# Patient Record
Sex: Female | Born: 1965 | Race: White | Hispanic: No | Marital: Married | Smoking: Never smoker
Health system: Southern US, Community
[De-identification: ages and names within clinical notes are randomized; demographics above are authoritative.]

## PROBLEM LIST (undated history)

## (undated) DIAGNOSIS — K2970 Gastritis, unspecified, without bleeding: Secondary | ICD-10-CM

## (undated) DIAGNOSIS — T7840XA Allergy, unspecified, initial encounter: Secondary | ICD-10-CM

## (undated) DIAGNOSIS — K219 Gastro-esophageal reflux disease without esophagitis: Secondary | ICD-10-CM

## (undated) DIAGNOSIS — N943 Premenstrual tension syndrome: Secondary | ICD-10-CM

## (undated) HISTORY — DX: Premenstrual tension syndrome: N94.3

## (undated) HISTORY — PX: EYE SURGERY: SHX253

## (undated) HISTORY — DX: Gastro-esophageal reflux disease without esophagitis: K21.9

## (undated) HISTORY — DX: Allergy, unspecified, initial encounter: T78.40XA

## (undated) HISTORY — DX: Gastritis, unspecified, without bleeding: K29.70

## (undated) HISTORY — PX: STRABISMUS SURGERY: SHX218

## (undated) HISTORY — PX: OTHER SURGICAL HISTORY: SHX169

---

## 2002-07-07 HISTORY — PX: BREAST BIOPSY: SHX20

## 2002-07-31 ENCOUNTER — Encounter: Admission: RE | Admit: 2002-07-31 | Discharge: 2002-07-31 | Payer: Self-pay | Admitting: Family Medicine

## 2002-07-31 ENCOUNTER — Encounter: Payer: Self-pay | Admitting: Family Medicine

## 2002-07-31 ENCOUNTER — Encounter (INDEPENDENT_AMBULATORY_CARE_PROVIDER_SITE_OTHER): Payer: Self-pay | Admitting: Specialist

## 2004-03-10 ENCOUNTER — Other Ambulatory Visit: Admission: RE | Admit: 2004-03-10 | Discharge: 2004-03-10 | Payer: Self-pay | Admitting: Family Medicine

## 2004-04-27 ENCOUNTER — Ambulatory Visit: Payer: Self-pay | Admitting: Family Medicine

## 2005-04-06 ENCOUNTER — Ambulatory Visit: Payer: Self-pay | Admitting: Family Medicine

## 2005-06-01 ENCOUNTER — Ambulatory Visit: Payer: Self-pay | Admitting: Internal Medicine

## 2005-07-21 ENCOUNTER — Ambulatory Visit: Payer: Self-pay | Admitting: Family Medicine

## 2006-03-07 ENCOUNTER — Ambulatory Visit: Payer: Self-pay | Admitting: Family Medicine

## 2006-05-03 ENCOUNTER — Ambulatory Visit: Payer: Self-pay | Admitting: Family Medicine

## 2006-05-03 ENCOUNTER — Other Ambulatory Visit: Admission: RE | Admit: 2006-05-03 | Discharge: 2006-05-03 | Payer: Self-pay | Admitting: Family Medicine

## 2006-05-03 ENCOUNTER — Encounter: Payer: Self-pay | Admitting: Family Medicine

## 2006-05-03 LAB — CONVERTED CEMR LAB: Pap Smear: NORMAL

## 2006-05-10 ENCOUNTER — Ambulatory Visit: Payer: Self-pay | Admitting: Family Medicine

## 2006-10-11 ENCOUNTER — Ambulatory Visit: Payer: Self-pay | Admitting: Family Medicine

## 2006-10-11 ENCOUNTER — Encounter: Payer: Self-pay | Admitting: Family Medicine

## 2006-12-18 ENCOUNTER — Ambulatory Visit: Payer: Self-pay | Admitting: Internal Medicine

## 2007-04-06 ENCOUNTER — Ambulatory Visit: Payer: Self-pay | Admitting: Family Medicine

## 2007-05-14 ENCOUNTER — Ambulatory Visit: Payer: Self-pay | Admitting: Family Medicine

## 2007-05-14 ENCOUNTER — Other Ambulatory Visit: Admission: RE | Admit: 2007-05-14 | Discharge: 2007-05-14 | Payer: Self-pay | Admitting: Family Medicine

## 2007-05-14 ENCOUNTER — Encounter: Payer: Self-pay | Admitting: Family Medicine

## 2007-05-14 DIAGNOSIS — M79609 Pain in unspecified limb: Secondary | ICD-10-CM | POA: Insufficient documentation

## 2007-05-15 ENCOUNTER — Encounter (INDEPENDENT_AMBULATORY_CARE_PROVIDER_SITE_OTHER): Payer: Self-pay | Admitting: Internal Medicine

## 2007-05-15 ENCOUNTER — Ambulatory Visit: Payer: Self-pay | Admitting: Family Medicine

## 2007-05-17 ENCOUNTER — Encounter (INDEPENDENT_AMBULATORY_CARE_PROVIDER_SITE_OTHER): Payer: Self-pay | Admitting: *Deleted

## 2007-05-17 LAB — CONVERTED CEMR LAB: Pap Smear: NORMAL

## 2007-05-24 ENCOUNTER — Encounter (INDEPENDENT_AMBULATORY_CARE_PROVIDER_SITE_OTHER): Payer: Self-pay | Admitting: *Deleted

## 2007-05-25 ENCOUNTER — Encounter: Payer: Self-pay | Admitting: Family Medicine

## 2007-06-06 ENCOUNTER — Telehealth: Payer: Self-pay | Admitting: Family Medicine

## 2008-03-05 ENCOUNTER — Ambulatory Visit: Payer: Self-pay | Admitting: Family Medicine

## 2008-05-20 ENCOUNTER — Encounter: Payer: Self-pay | Admitting: Family Medicine

## 2008-05-20 ENCOUNTER — Ambulatory Visit: Payer: Self-pay | Admitting: Family Medicine

## 2008-05-20 ENCOUNTER — Other Ambulatory Visit: Admission: RE | Admit: 2008-05-20 | Discharge: 2008-05-20 | Payer: Self-pay | Admitting: Family Medicine

## 2008-05-21 LAB — CONVERTED CEMR LAB
ALT: 19 units/L (ref 0–35)
AST: 16 units/L (ref 0–37)
Albumin: 3.8 g/dL (ref 3.5–5.2)
Alkaline Phosphatase: 60 units/L (ref 39–117)
BUN: 7 mg/dL (ref 6–23)
Basophils Absolute: 0 10*3/uL (ref 0.0–0.1)
Basophils Relative: 0 % (ref 0.0–3.0)
Bilirubin, Direct: 0.1 mg/dL (ref 0.0–0.3)
CO2: 28 meq/L (ref 19–32)
Calcium: 8.9 mg/dL (ref 8.4–10.5)
Chloride: 108 meq/L (ref 96–112)
Cholesterol: 169 mg/dL (ref 0–200)
Creatinine, Ser: 0.6 mg/dL (ref 0.4–1.2)
Eosinophils Absolute: 0.1 10*3/uL (ref 0.0–0.7)
Eosinophils Relative: 0.9 % (ref 0.0–5.0)
GFR calc Af Amer: 141 mL/min
GFR calc non Af Amer: 117 mL/min
Glucose, Bld: 90 mg/dL (ref 70–99)
HCT: 36.5 % (ref 36.0–46.0)
HDL: 49.4 mg/dL (ref >39.0)
Hemoglobin: 12.6 g/dL (ref 12.0–15.0)
LDL Cholesterol: 109 mg/dL — ABNORMAL HIGH (ref 0–99)
Lymphocytes Relative: 30.9 % (ref 12.0–46.0)
MCHC: 34.5 g/dL (ref 30.0–36.0)
MCV: 88.7 fL (ref 78.0–100.0)
Monocytes Absolute: 0.5 10*3/uL (ref 0.1–1.0)
Monocytes Relative: 8.4 % (ref 3.0–12.0)
Neutro Abs: 3.5 10*3/uL (ref 1.4–7.7)
Neutrophils Relative %: 59.8 % (ref 43.0–77.0)
Platelets: 200 10*3/uL (ref 150–400)
Potassium: 3.7 meq/L (ref 3.5–5.1)
RBC: 4.12 M/uL (ref 3.87–5.11)
RDW: 11.5 % (ref 11.5–14.6)
Sodium: 141 meq/L (ref 135–145)
TSH: 1.77 microintl units/mL (ref 0.35–5.50)
Total Bilirubin: 0.5 mg/dL (ref 0.3–1.2)
Total CHOL/HDL Ratio: 3.4
Total Protein: 6.9 g/dL (ref 6.0–8.3)
Triglycerides: 53 mg/dL (ref 0–149)
VLDL: 11 mg/dL (ref 0–40)
WBC: 5.9 10*3/uL (ref 4.5–10.5)

## 2008-06-04 ENCOUNTER — Ambulatory Visit: Payer: Self-pay | Admitting: Family Medicine

## 2008-06-05 ENCOUNTER — Encounter: Payer: Self-pay | Admitting: Family Medicine

## 2008-10-16 ENCOUNTER — Emergency Department: Payer: Self-pay | Admitting: Emergency Medicine

## 2008-10-16 ENCOUNTER — Telehealth (INDEPENDENT_AMBULATORY_CARE_PROVIDER_SITE_OTHER): Payer: Self-pay | Admitting: *Deleted

## 2009-03-18 ENCOUNTER — Ambulatory Visit: Payer: Self-pay | Admitting: Family Medicine

## 2010-01-27 ENCOUNTER — Ambulatory Visit: Payer: Self-pay | Admitting: Family Medicine

## 2010-01-27 ENCOUNTER — Telehealth: Payer: Self-pay | Admitting: Family Medicine

## 2010-01-27 DIAGNOSIS — K2970 Gastritis, unspecified, without bleeding: Secondary | ICD-10-CM | POA: Insufficient documentation

## 2010-01-27 DIAGNOSIS — K2990 Gastroduodenitis, unspecified, without bleeding: Secondary | ICD-10-CM

## 2010-01-28 ENCOUNTER — Encounter: Payer: Self-pay | Admitting: Family Medicine

## 2010-01-29 ENCOUNTER — Telehealth: Payer: Self-pay | Admitting: Family Medicine

## 2010-02-25 ENCOUNTER — Telehealth: Payer: Self-pay | Admitting: Family Medicine

## 2010-02-25 ENCOUNTER — Encounter: Payer: Self-pay | Admitting: Family Medicine

## 2010-03-16 ENCOUNTER — Ambulatory Visit: Payer: Self-pay | Admitting: Family Medicine
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
## 2010-07-06 NOTE — Progress Notes (Signed)
Summary: needs something for reflux  Phone Note Call from Patient   Caller: Patient Call For: Judith Part MD Summary of Call: Pt's insurance company denied prior auth for nexium.  Pt is asking if [facility] [place]. Initial call taken by: Lowella Petties CMA,  January 29, 2010 10:43 AM  Follow-up for Phone Call        need to change to lansoprazole (that is generic prevacid) px written on EMR for call in  let me know if this does not work well Follow-up by: Judith Part MD,  January 29, 2010 11:18 AM  Additional Follow-up for Phone Call Additional follow up Details #1::        [facility] as instructed. Left message for patient to call back. Lewanda Rife LPN  January 29, 2010 12:34 PM   Advised pt. Additional Follow-up by: Lowella Petties CMA,  January 29, 2010 3:42 PM    New/Updated Medications: LANSOPRAZOLE 30 MG CPDR (LANSOPRAZOLE) 1 by mouth once daily in am Prescriptions: LANSOPRAZOLE 30 MG CPDR (LANSOPRAZOLE) 1 by mouth once daily in am  #30 x 11   Entered and Authorized by:   Judith Part MD   Signed by:   Lewanda Rife LPN on 86/57/8469   Method used:   Telephoned to ...         RxID:   [id number]

## 2010-07-06 NOTE — Medication Information (Signed)
Summary: Prior Authorization & Denial for Nexium/[facility]  Prior Authorization & Denial for Nexium/[facility]   Imported By: Lanelle Bal 02/09/2010 08:30:14  _____________________________________________________________________  External Attachment:    Type:   Image     Comment:   External Document

## 2010-07-06 NOTE — Progress Notes (Signed)
Summary: prior auth for nexium   Phone Note Call from Patient   Caller: [facility] Call For: Judith Part MD Summary of Call: Prior auth is required for nexium. Form is on your shelf.  Initial call taken by: Melody Comas,  January 27, 2010 4:19 PM  Follow-up for Phone Call        form done and in nurse in box   Follow-up by: Judith Part MD,  January 27, 2010 5:03 PM  Additional Follow-up for Phone Call Additional follow up Details #1::        Completed form faxed to [phone] as instructed. Form given to [place].Lewanda Rife LPN  January 28, 2010 8:53 AM      Appended Document: prior auth for nexium  Prior Berkley Harvey was denied for  nexium, letter is on your shelf.

## 2010-07-06 NOTE — Progress Notes (Signed)
Summary: regarding lansoprazole  Phone Note Call from Patient Call back at Home Phone [phone]   Caller: Patient Call For: Judith Part MD Summary of Call: Pt has been taking lansoprazole for about a month.  This has helped some but she still has burning, gas.  She is asking if she should try something else or give this more time to work.  We can try to get a prior auth on nexium, since this isnt working well.  [facility] [place] Initial call taken by: Lowella Petties CMA,  February 25, 2010 1:00 PM  Follow-up for Phone Call        lets do another prior Berkley Harvey now that she has failed that -thanks  Follow-up by: Judith Part MD,  February 25, 2010 1:10 PM  Additional Follow-up for Phone Call Additional follow up Details #1::        Prior auth given over the phone for nexium.  We will need to [facility] [place].         Lowella Petties CMA  February 25, 2010 3:50 PM  px written on EMR for call in   Additional Follow-up by: Judith Part MD,  February 25, 2010 7:47 PM    Additional Follow-up for Phone Call Additional follow up Details #2::    Pt advised that nexium will be sent to pharmacy.  [facility] [place].         Lowella Petties CMA  February 26, 2010 9:10 AM   New/Updated Medications: NEXIUM 40 MG CPDR (ESOMEPRAZOLE MAGNESIUM) 1 by mouth once daily in am Prescriptions: NEXIUM 40 MG CPDR (ESOMEPRAZOLE MAGNESIUM) 1 by mouth once daily in am  #90 x 3   Entered by:   Lowella Petties CMA   Authorized by:   Judith Part MD   Signed by:   Lowella Petties CMA on 02/26/2010   Method used:   Telephoned to ...       [facility] [phone][facility] (retail)       [address]       [place], [place]  [address]       Ph: [phone]       Fax: [phone]   RxID:   [id number]

## 2010-07-06 NOTE — Assessment & Plan Note (Signed)
Summary: [facility] flu shot/rbh  Nurse Visit   Allergies: 1)  * Oxycodone  Orders Added: 1)  Admin 1st Vaccine [90471] 2)  Flu Vaccine 23yrs + [[address]109]  Flu Vaccine Consent Questions     Do you have a history of severe allergic reactions to this vaccine? no    Any prior history of allergic reactions to egg and/or gelatin? no    Do you have a sensitivity to the preservative Thimersol? no    Do you have a past history of Guillan-Barre Syndrome? no    Do you currently have an acute febrile illness? no    Have you ever had a severe reaction to latex? no    Vaccine information given and explained to patient? yes    Are you currently pregnant? no    Lot Number:[id number]   Exp Date:12/04/2010   Site Given  Left Deltoid IM

## 2010-07-06 NOTE — Medication Information (Signed)
Summary: Nexium Approved  Nexium Approved   Imported By: Maryln Gottron 03/05/2010 12:20:44  _____________________________________________________________________  External Attachment:    Type:   Image     Comment:   External Document

## 2010-07-30 ENCOUNTER — Telehealth (INDEPENDENT_AMBULATORY_CARE_PROVIDER_SITE_OTHER): Payer: Self-pay | Admitting: *Deleted

## 2010-07-30 ENCOUNTER — Other Ambulatory Visit (INDEPENDENT_AMBULATORY_CARE_PROVIDER_SITE_OTHER): Payer: 59

## 2010-07-30 ENCOUNTER — Other Ambulatory Visit: Payer: Self-pay | Admitting: Family Medicine

## 2010-07-30 ENCOUNTER — Encounter (INDEPENDENT_AMBULATORY_CARE_PROVIDER_SITE_OTHER): Payer: Self-pay | Admitting: *Deleted

## 2010-07-30 DIAGNOSIS — Z0000 Encounter for general adult medical examination without abnormal findings: Secondary | ICD-10-CM

## 2010-07-30 LAB — LIPID PANEL
Cholesterol: 180 mg/dL (ref 0–200)
LDL Cholesterol: 120 mg/dL — ABNORMAL HIGH (ref 0–99)

## 2010-07-30 LAB — BASIC METABOLIC PANEL
BUN: 12 mg/dL (ref 6–23)
CO2: 27 mEq/L (ref 19–32)
Chloride: 108 mEq/L (ref 96–112)
Creatinine, Ser: 0.8 mg/dL (ref 0.4–1.2)
Potassium: 4.4 mEq/L (ref 3.5–5.1)
Sodium: 141 mEq/L (ref 135–145)

## 2010-07-30 LAB — HEPATIC FUNCTION PANEL
ALT: 18 U/L (ref 0–35)
AST: 17 U/L (ref 0–37)
Albumin: 4 g/dL (ref 3.5–5.2)
Alkaline Phosphatase: 66 U/L (ref 39–117)
Total Bilirubin: 0.5 mg/dL (ref 0.3–1.2)
Total Protein: 7.1 g/dL (ref 6.0–8.3)

## 2010-07-30 LAB — CBC WITH DIFFERENTIAL/PLATELET
Basophils Relative: 0.5 % (ref 0.0–3.0)
Eosinophils Relative: 1.2 % (ref 0.0–5.0)
HCT: 36.5 % (ref 36.0–46.0)
Hemoglobin: 12.5 g/dL (ref 12.0–15.0)
Lymphocytes Relative: 39.4 % (ref 12.0–46.0)
Lymphs Abs: 2 10*3/uL (ref 0.7–4.0)
MCHC: 34.3 g/dL (ref 30.0–36.0)
MCV: 88.9 fl (ref 78.0–100.0)
Monocytes Absolute: 0.4 10*3/uL (ref 0.1–1.0)
Neutrophils Relative %: 51.8 % (ref 43.0–77.0)
Platelets: 186 10*3/uL (ref 150.0–400.0)
RBC: 4.1 Mil/uL (ref 3.87–5.11)
WBC: 5.2 10*3/uL (ref 4.5–10.5)

## 2010-07-30 LAB — TSH: TSH: 1.79 u[IU]/mL (ref 0.35–5.50)

## 2010-08-03 ENCOUNTER — Other Ambulatory Visit (HOSPITAL_COMMUNITY)
Admission: RE | Admit: 2010-08-03 | Discharge: 2010-08-03 | Disposition: A | Discharge status: Home / Self Care | Payer: 59 | Source: Ambulatory Visit | Attending: Family Medicine | Admitting: Family Medicine

## 2010-08-03 ENCOUNTER — Encounter: Payer: Self-pay | Admitting: Family Medicine

## 2010-08-03 ENCOUNTER — Encounter (INDEPENDENT_AMBULATORY_CARE_PROVIDER_SITE_OTHER): Payer: 59 | Admitting: Family Medicine

## 2010-08-03 ENCOUNTER — Other Ambulatory Visit: Payer: Self-pay | Admitting: Family Medicine

## 2010-08-03 DIAGNOSIS — K2970 Gastritis, unspecified, without bleeding: Secondary | ICD-10-CM

## 2010-08-03 DIAGNOSIS — Z01419 Encounter for gynecological examination (general) (routine) without abnormal findings: Secondary | ICD-10-CM

## 2010-08-03 DIAGNOSIS — Z1231 Encounter for screening mammogram for malignant neoplasm of breast: Secondary | ICD-10-CM

## 2010-08-03 DIAGNOSIS — Z124 Encounter for screening for malignant neoplasm of cervix: Secondary | ICD-10-CM | POA: Insufficient documentation

## 2010-08-03 DIAGNOSIS — Z0000 Encounter for general adult medical examination without abnormal findings: Secondary | ICD-10-CM

## 2010-08-03 DIAGNOSIS — Z1159 Encounter for screening for other viral diseases: Secondary | ICD-10-CM | POA: Insufficient documentation

## 2010-08-03 LAB — HM PAP SMEAR

## 2010-08-03 NOTE — Progress Notes (Signed)
----   Converted from flag ---- ---- 07/29/2010 8:04 PM, Judith Part MD wrote: please check wellness and lipid /v70.0 thanks   ---- 07/27/2010 11:53 AM, Liane Comber CMA (AAMA) wrote: Lab orders please! Good Morning! This pt is scheduled for cpx labs Friday, which labs to draw and dx codes to use? Thanks Tasha ------------------------------

## 2010-08-06 ENCOUNTER — Encounter (INDEPENDENT_AMBULATORY_CARE_PROVIDER_SITE_OTHER): Payer: Self-pay | Admitting: *Deleted
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
## 2010-08-12 NOTE — Letter (Signed)
Summary: Results Follow up Letter  [place] [facility]  [address] [place], [place] [address]   Phone: [phone]  Fax: [phone]    08/06/2010 MRN: [id number]    [facility] [address] [place], [place]  [address]  [place]    Dear Ms. Mele,  The following are the results of your recent test(s):  Test         Result    Pap Smear:        Normal __X___  Not Normal _____ Comments: ______________________________________________________ Cholesterol: LDL(Bad cholesterol):         Your goal is less than:         HDL (Good cholesterol):       Your goal is more than: Comments:  ______________________________________________________ Mammogram:        Normal _____  Not Normal _____ Comments:  ___________________________________________________________________ Hemoccult:        Normal _____  Not normal _______ Comments:    _____________________________________________________________________ Other Tests:    We routinely do not discuss normal results over the telephone.  If you desire a copy of the results, or you have any questions about this information we can discuss them at your next office visit.   Sincerely,    Marne A. Milinda Antis, M.D.  MAT:lsf

## 2010-08-17 ENCOUNTER — Encounter: Payer: Self-pay | Admitting: Family Medicine

## 2010-08-17 ENCOUNTER — Ambulatory Visit: Payer: Self-pay | Admitting: Family Medicine

## 2010-08-17 LAB — HM MAMMOGRAPHY: HM Mammogram: NORMAL

## 2010-08-23 ENCOUNTER — Encounter: Payer: Self-pay | Admitting: Family Medicine

## 2010-09-02 NOTE — Letter (Signed)
Summary: Results Follow up Letter  [place] at [facility]  [address] [place], [place] [address]   Phone: [phone]  Fax: [phone]    08/23/2010 MRN: [id number]    [facility] [address] [place], [place]  [address]  [place]    Dear Ms. Chavers,  The following are the results of your recent test(s):  Test         Result    Pap Smear:        Normal _____  Not Normal _____ Comments: ______________________________________________________ Cholesterol: LDL(Bad cholesterol):         Your goal is less than:         HDL (Good cholesterol):       Your goal is more than: Comments:  ______________________________________________________ Mammogram:        Normal __X___  Not Normal _____ Comments:Repeat in one year.   ___________________________________________________________________ Hemoccult:        Normal _____  Not normal _______ Comments:    _____________________________________________________________________ Other Tests:    We routinely do not discuss normal results over the telephone.  If you desire a copy of the results, or you have any questions about this information we can discuss them at your next office visit.   Sincerely,    Idamae Schuller [facility],MD  MT/ri

## 2011-03-22 ENCOUNTER — Ambulatory Visit (INDEPENDENT_AMBULATORY_CARE_PROVIDER_SITE_OTHER): Payer: 59

## 2011-03-22 DIAGNOSIS — Z23 Encounter for immunization: Secondary | ICD-10-CM

## 2011-11-03 ENCOUNTER — Telehealth: Payer: Self-pay | Admitting: Family Medicine

## 2011-11-03 DIAGNOSIS — Z0000 Encounter for general adult medical examination without abnormal findings: Secondary | ICD-10-CM | POA: Insufficient documentation

## 2011-11-03 NOTE — Telephone Encounter (Signed)
Message copied by Judy Pimple on Thu Nov 03, 2011  7:02 PM ------      Message from: Baldomero Lamy      Created: Wed Oct 26, 2011 10:18 AM      Regarding: Cpx labsFri 5/31       Please order  future cpx labs for pt's upcomming lab appt.      Thanks      Rodney Booze

## 2011-11-04 ENCOUNTER — Other Ambulatory Visit (INDEPENDENT_AMBULATORY_CARE_PROVIDER_SITE_OTHER): Payer: 59

## 2011-11-04 DIAGNOSIS — Z0000 Encounter for general adult medical examination without abnormal findings: Secondary | ICD-10-CM

## 2011-11-04 LAB — COMPREHENSIVE METABOLIC PANEL
Albumin: 3.9 g/dL (ref 3.5–5.2)
BUN: 15 mg/dL (ref 6–23)
CO2: 25 mEq/L (ref 19–32)
Calcium: 8.8 mg/dL (ref 8.4–10.5)
Chloride: 106 mEq/L (ref 96–112)
GFR: 102.42 mL/min (ref >60.00)
Glucose, Bld: 82 mg/dL (ref 70–99)
Potassium: 4.1 mEq/L (ref 3.5–5.1)

## 2011-11-04 LAB — CBC WITH DIFFERENTIAL/PLATELET
Basophils Relative: 0.5 % (ref 0.0–3.0)
Eosinophils Relative: 0.9 % (ref 0.0–5.0)
MCV: 89.4 fl (ref 78.0–100.0)
Monocytes Absolute: 0.4 10*3/uL (ref 0.1–1.0)
Monocytes Relative: 7.9 % (ref 3.0–12.0)
Neutrophils Relative %: 52.8 % (ref 43.0–77.0)
RBC: 4.23 Mil/uL (ref 3.87–5.11)
WBC: 5.2 10*3/uL (ref 4.5–10.5)

## 2011-11-04 LAB — LIPID PANEL
Cholesterol: 178 mg/dL (ref 0–200)
Triglycerides: 46 mg/dL (ref 0.0–149.0)

## 2011-11-08 ENCOUNTER — Ambulatory Visit (INDEPENDENT_AMBULATORY_CARE_PROVIDER_SITE_OTHER): Payer: 59 | Admitting: Family Medicine

## 2011-11-08 ENCOUNTER — Other Ambulatory Visit (HOSPITAL_COMMUNITY)
Admission: RE | Admit: 2011-11-08 | Discharge: 2011-11-08 | Disposition: A | Discharge status: Home / Self Care | Payer: 59 | Source: Ambulatory Visit | Attending: Family Medicine | Admitting: Family Medicine

## 2011-11-08 ENCOUNTER — Encounter: Payer: Self-pay | Admitting: Family Medicine

## 2011-11-08 VITALS — BP 102/62 | HR 83 | Temp 97.8°F | Ht 62.0 in | Wt 139.0 lb

## 2011-11-08 DIAGNOSIS — Z0000 Encounter for general adult medical examination without abnormal findings: Secondary | ICD-10-CM

## 2011-11-08 DIAGNOSIS — Z01419 Encounter for gynecological examination (general) (routine) without abnormal findings: Secondary | ICD-10-CM | POA: Insufficient documentation

## 2011-11-08 DIAGNOSIS — Z1231 Encounter for screening mammogram for malignant neoplasm of breast: Secondary | ICD-10-CM

## 2011-11-08 NOTE — Patient Instructions (Signed)
We will schedule your mammogram at check out  Keep up the healthy diet  Start to think about a regular exercise program - optimally 30 minutes 5 days per week Labs look ok

## 2011-11-08 NOTE — Assessment & Plan Note (Signed)
Reviewed health habits including diet and exercise and skin cancer prevention Also reviewed health mt list, fam hx and immunizations   Urged to keep up better diet - this is controlling her stomach issues and also her cholesterol Wellness labs rev today

## 2011-11-08 NOTE — Assessment & Plan Note (Signed)
Scheduled annual screening mammogram Nl breast exam today  Encouraged monthly self exams   

## 2011-11-08 NOTE — Assessment & Plan Note (Signed)
Annual exam done today with pap  Some perimenopausal menses changes- pt tolerating that ok  No need for contaception- husband had a vasectomy
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
## 2011-11-15 ENCOUNTER — Ambulatory Visit: Payer: Self-pay | Admitting: Family Medicine

## 2011-11-16 ENCOUNTER — Encounter: Payer: Self-pay | Admitting: Family Medicine

## 2011-11-18 ENCOUNTER — Telehealth: Payer: Self-pay

## 2011-11-18 NOTE — Telephone Encounter (Signed)
To send mammogram result letter

## 2012-01-12 ENCOUNTER — Telehealth: Payer: Self-pay | Admitting: *Deleted

## 2012-01-12 NOTE — Telephone Encounter (Signed)
Prior auth is needed for nexium, form is on your shelf. 

## 2012-01-13 NOTE — Telephone Encounter (Signed)
Left message on patient Vm to call about Nexium Rx.

## 2012-01-13 NOTE — Telephone Encounter (Signed)
I looked at this - it looks like her insurance prefers generic prevacid (lansoprazole)  Has she tried this before and failed it or had a reaction? If not we will need to change the px to that or she will have to pay more for the nexium Let me know I will hold the prior auth on my desk

## 2012-01-17 NOTE — Telephone Encounter (Signed)
Bjorn Loser, can this note be closed?

## 2014-12-30 ENCOUNTER — Ambulatory Visit (INDEPENDENT_AMBULATORY_CARE_PROVIDER_SITE_OTHER): Payer: 59 | Admitting: Family Medicine

## 2014-12-30 ENCOUNTER — Other Ambulatory Visit (HOSPITAL_COMMUNITY)
Admission: RE | Admit: 2014-12-30 | Discharge: 2014-12-30 | Disposition: A | Discharge status: Home / Self Care | Payer: 59 | Source: Ambulatory Visit | Attending: Family Medicine | Admitting: Family Medicine

## 2014-12-30 ENCOUNTER — Encounter: Payer: Self-pay | Admitting: Family Medicine

## 2014-12-30 VITALS — BP 132/72 | HR 71 | Temp 98.6°F | Ht 62.0 in | Wt 158.2 lb

## 2014-12-30 DIAGNOSIS — Z0000 Encounter for general adult medical examination without abnormal findings: Secondary | ICD-10-CM | POA: Diagnosis not present

## 2014-12-30 DIAGNOSIS — Z01419 Encounter for gynecological examination (general) (routine) without abnormal findings: Secondary | ICD-10-CM | POA: Insufficient documentation

## 2014-12-30 DIAGNOSIS — Z1151 Encounter for screening for human papillomavirus (HPV): Secondary | ICD-10-CM | POA: Insufficient documentation

## 2014-12-30 LAB — HM PAP SMEAR: HM PAP: NORMAL

## 2014-12-30 NOTE — Assessment & Plan Note (Signed)
Reviewed health habits including diet and exercise and skin cancer prevention Reviewed appropriate screening tests for age  Also reviewed health mt list, fam hx and immunization status , as well as social and family history   See HPI Labs drawn for wellness today  Pt will schedule her own mammogram  Gyn exam done
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
## 2014-12-30 NOTE — Progress Notes (Signed)
Pre visit review using our clinic review tool, if applicable. No additional management support is needed unless otherwise documented below in the visit note. 

## 2014-12-30 NOTE — Assessment & Plan Note (Signed)
3 year exam and pap in perimenopausal female Overall doing well  Some irregular menses-disc expectations

## 2014-12-30 NOTE — Patient Instructions (Signed)
Labs today  Pap done today  Don't forget to schedule your mammogram

## 2014-12-31 LAB — COMPREHENSIVE METABOLIC PANEL
ALBUMIN: 4.4 g/dL (ref 3.5–5.2)
ALK PHOS: 87 U/L (ref 39–117)
ALT: 31 U/L (ref 0–35)
AST: 25 U/L (ref 0–37)
BILIRUBIN TOTAL: 0.5 mg/dL (ref 0.2–1.2)
BUN: 11 mg/dL (ref 6–23)
CALCIUM: 9.6 mg/dL (ref 8.4–10.5)
CO2: 28 mEq/L (ref 19–32)
CREATININE: 0.71 mg/dL (ref 0.40–1.20)
Chloride: 102 mEq/L (ref 96–112)
GFR: 92.89 mL/min (ref >60.00)
Glucose, Bld: 78 mg/dL (ref 70–99)
Potassium: 3.8 mEq/L (ref 3.5–5.1)
SODIUM: 138 meq/L (ref 135–145)
Total Protein: 7.9 g/dL (ref 6.0–8.3)

## 2014-12-31 LAB — CBC WITH DIFFERENTIAL/PLATELET
Basophils Absolute: 0.1 10*3/uL (ref 0.0–0.1)
Basophils Relative: 1 % (ref 0.0–3.0)
EOS PCT: 2.5 % (ref 0.0–5.0)
Eosinophils Absolute: 0.2 10*3/uL (ref 0.0–0.7)
HEMATOCRIT: 38.1 % (ref 36.0–46.0)
Hemoglobin: 12.8 g/dL (ref 12.0–15.0)
LYMPHS ABS: 1.8 10*3/uL (ref 0.7–4.0)
LYMPHS PCT: 23.4 % (ref 12.0–46.0)
MCHC: 33.7 g/dL (ref 30.0–36.0)
MCV: 87.5 fl (ref 78.0–100.0)
MONO ABS: 0.5 10*3/uL (ref 0.1–1.0)
Monocytes Relative: 5.9 % (ref 3.0–12.0)
Neutro Abs: 5.1 10*3/uL (ref 1.4–7.7)
Neutrophils Relative %: 67.2 % (ref 43.0–77.0)
PLATELETS: 238 10*3/uL (ref 150.0–400.0)
RBC: 4.35 Mil/uL (ref 3.87–5.11)
RDW: 12.8 % (ref 11.5–15.5)
WBC: 7.6 10*3/uL (ref 4.0–10.5)

## 2014-12-31 LAB — LIPID PANEL
CHOLESTEROL: 199 mg/dL (ref 0–200)
HDL: 58.3 mg/dL (ref >39.00)
LDL CALC: 126 mg/dL — AB (ref 0–99)
NONHDL: 140.7
TRIGLYCERIDES: 73 mg/dL (ref 0.0–149.0)
Total CHOL/HDL Ratio: 3
VLDL: 14.6 mg/dL (ref 0.0–40.0)

## 2014-12-31 LAB — TSH: TSH: 2.16 u[IU]/mL (ref 0.35–4.50)

## 2014-12-31 LAB — CYTOLOGY - PAP

## 2015-01-01 ENCOUNTER — Other Ambulatory Visit: Payer: Self-pay | Admitting: Family Medicine

## 2015-01-01 DIAGNOSIS — Z1231 Encounter for screening mammogram for malignant neoplasm of breast: Secondary | ICD-10-CM

## 2015-01-05 ENCOUNTER — Encounter: Payer: Self-pay | Admitting: *Deleted

## 2015-01-05 ENCOUNTER — Ambulatory Visit
Admission: RE | Admit: 2015-01-05 | Discharge: 2015-01-05 | Disposition: A | Discharge status: Home / Self Care | Payer: 59 | Source: Ambulatory Visit | Attending: Family Medicine | Admitting: Family Medicine

## 2015-01-05 DIAGNOSIS — Z1231 Encounter for screening mammogram for malignant neoplasm of breast: Secondary | ICD-10-CM | POA: Diagnosis present

## 2016-08-19 IMAGING — MG MM DIGITAL SCREENING BILAT W/ CAD
4 series · 4 of 4 positions shown · non-contrast
Comparison: Previous exam(s).

CLINICAL DATA: Screening.

EXAM:
DIGITAL SCREENING BILATERAL MAMMOGRAM WITH CAD

[R MLO]
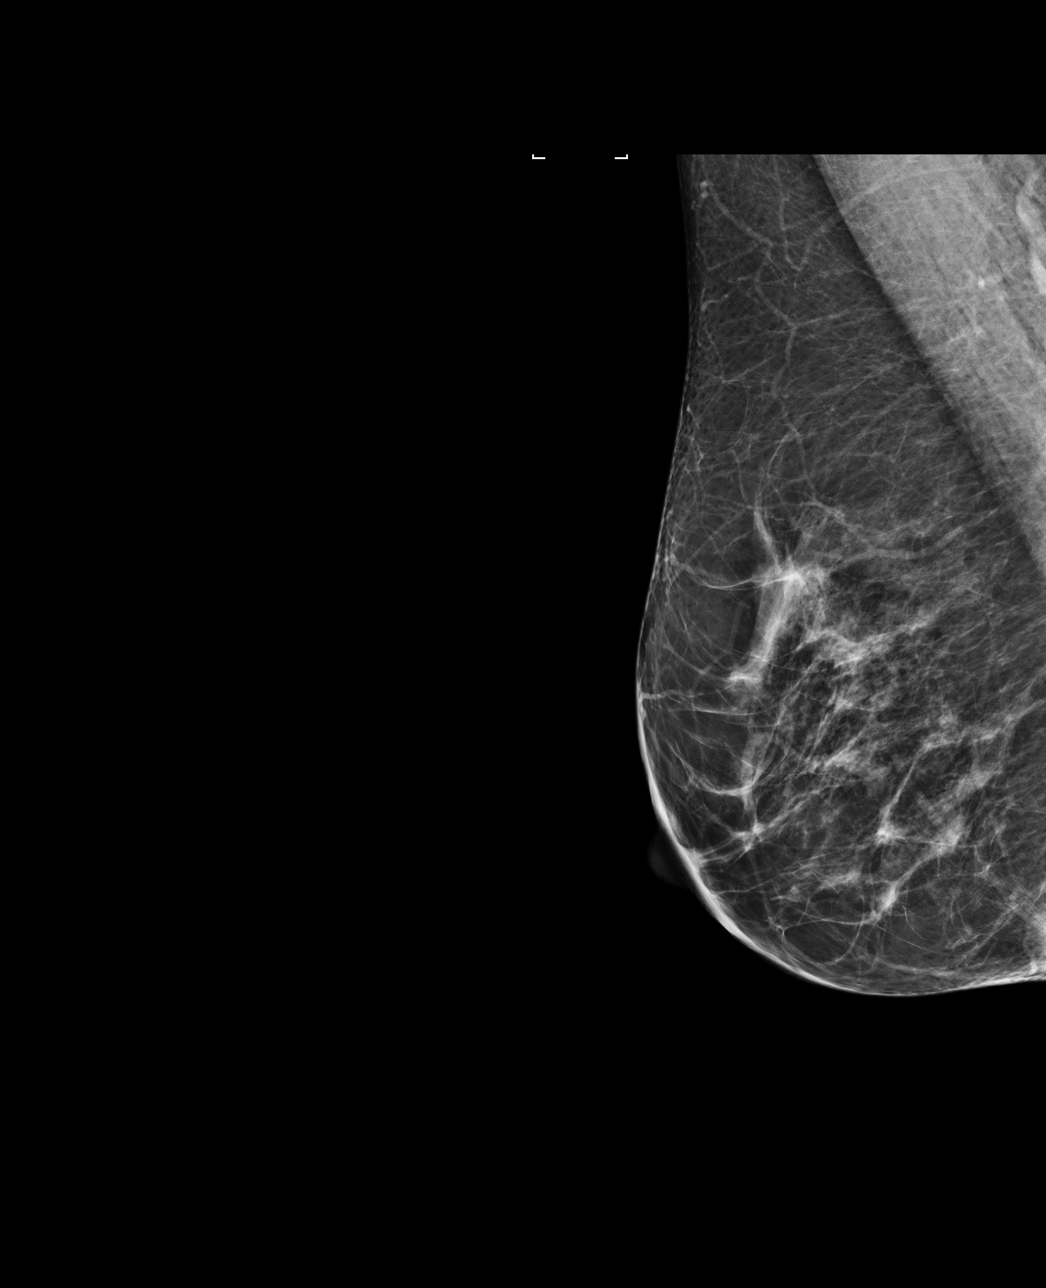

[L CC]
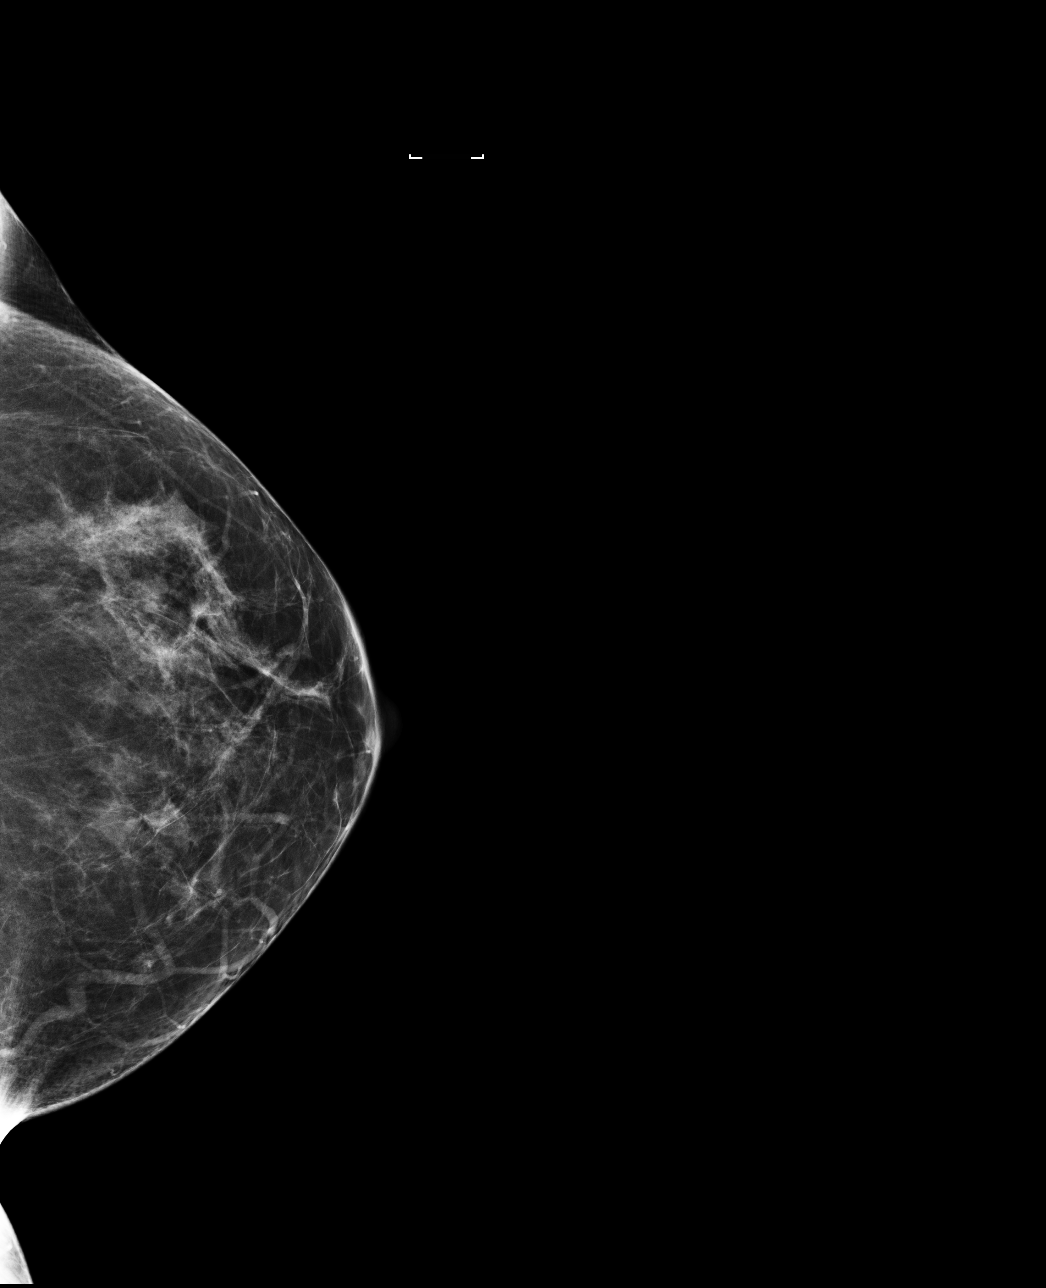

[L MLO]
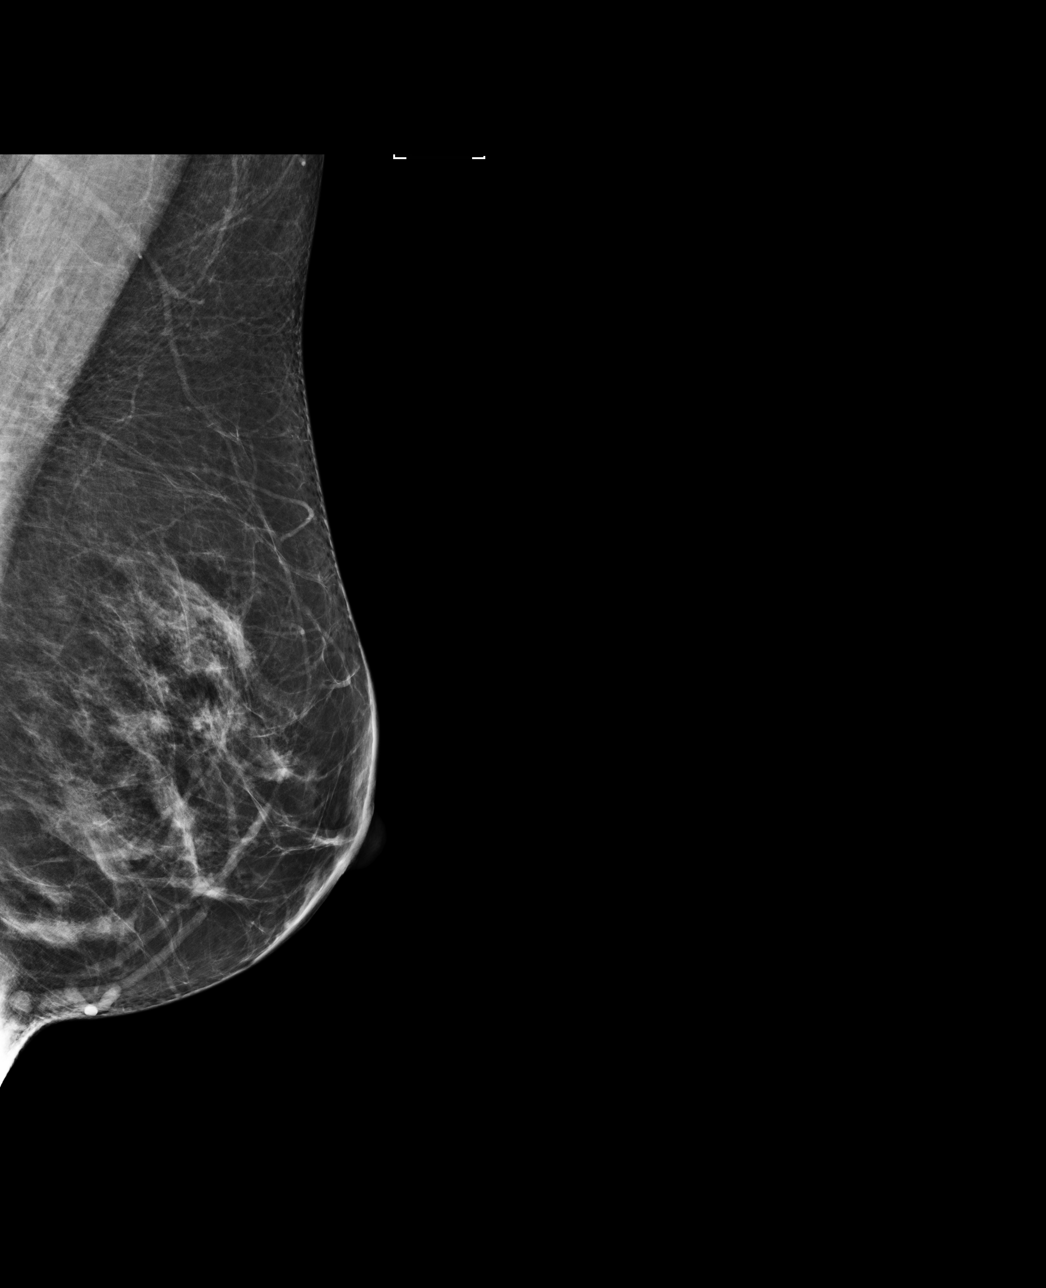

[R CC]
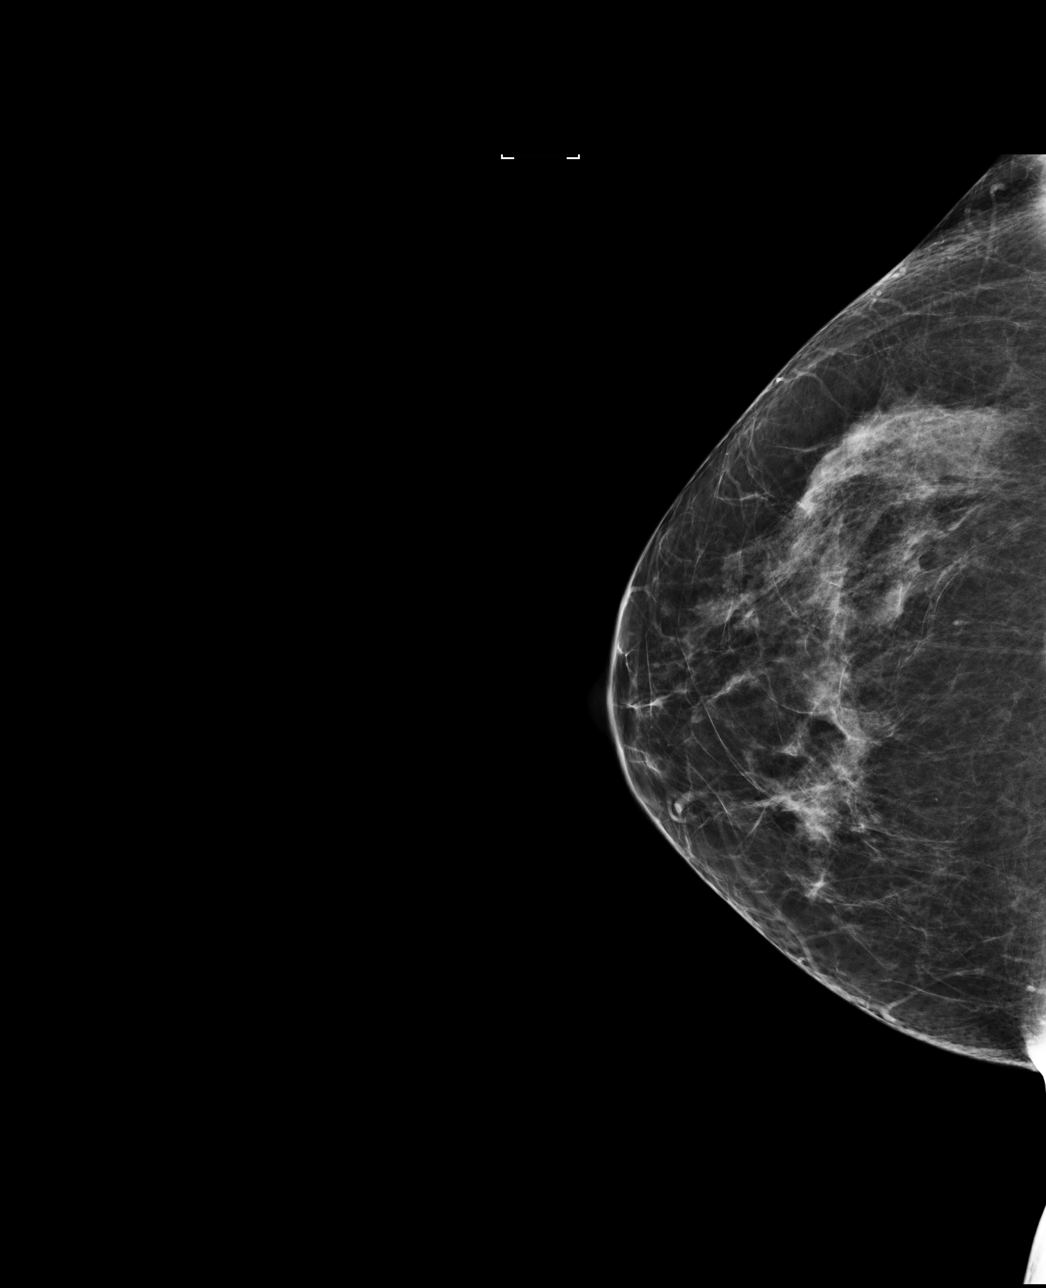

[4 of 4 positions shown; findings below may reference images not displayed]

ACR Breast Density Category b: There are scattered areas of
fibroglandular density.
FINDINGS: There are no findings suspicious for malignancy. Images were
processed with CAD.
IMPRESSION: No mammographic evidence of malignancy. A result letter of this
screening mammogram will be mailed directly to the patient.

RECOMMENDATION:
Screening mammogram in one year. (Code:AS-G-LCT)

BI-RADS CATEGORY  1: Negative.

## 2017-01-16 ENCOUNTER — Telehealth: Payer: Self-pay | Admitting: Family Medicine

## 2017-01-16 DIAGNOSIS — Z0000 Encounter for general adult medical examination without abnormal findings: Secondary | ICD-10-CM

## 2017-01-16 NOTE — Telephone Encounter (Signed)
-----   Message from Terri J Walsh sent at 01/12/2017 10:18 AM EDT ----- Regarding: Lab orders for Tuesday, 8.14.18 Patient is scheduled for CPX labs, please order future labs, Thanks , Terri  

## 2017-01-17 ENCOUNTER — Other Ambulatory Visit (INDEPENDENT_AMBULATORY_CARE_PROVIDER_SITE_OTHER): Payer: 59

## 2017-01-17 DIAGNOSIS — Z0000 Encounter for general adult medical examination without abnormal findings: Secondary | ICD-10-CM

## 2017-01-17 LAB — COMPREHENSIVE METABOLIC PANEL
ALT: 39 U/L — ABNORMAL HIGH (ref 0–35)
AST: 25 U/L (ref 0–37)
Albumin: 4.3 g/dL (ref 3.5–5.2)
Alkaline Phosphatase: 97 U/L (ref 39–117)
BUN: 14 mg/dL (ref 6–23)
CO2: 28 mEq/L (ref 19–32)
Calcium: 9.7 mg/dL (ref 8.4–10.5)
Chloride: 104 mEq/L (ref 96–112)
Creatinine, Ser: 0.69 mg/dL (ref 0.40–1.20)
GFR: 95.21 mL/min (ref >60.00)
Glucose, Bld: 90 mg/dL (ref 70–99)
Potassium: 4.2 mEq/L (ref 3.5–5.1)
Sodium: 139 mEq/L (ref 135–145)
Total Bilirubin: 0.5 mg/dL (ref 0.2–1.2)
Total Protein: 7.1 g/dL (ref 6.0–8.3)

## 2017-01-17 LAB — CBC WITH DIFFERENTIAL/PLATELET
Basophils Absolute: 0 10*3/uL (ref 0.0–0.1)
Basophils Relative: 0.4 % (ref 0.0–3.0)
Eosinophils Absolute: 0.1 10*3/uL (ref 0.0–0.7)
Eosinophils Relative: 2.1 % (ref 0.0–5.0)
HCT: 39.5 % (ref 36.0–46.0)
Hemoglobin: 13.1 g/dL (ref 12.0–15.0)
Lymphocytes Relative: 31.2 % (ref 12.0–46.0)
Lymphs Abs: 1.8 10*3/uL (ref 0.7–4.0)
MCHC: 33.1 g/dL (ref 30.0–36.0)
MCV: 88.4 fl (ref 78.0–100.0)
Monocytes Absolute: 0.4 10*3/uL (ref 0.1–1.0)
Monocytes Relative: 6.3 % (ref 3.0–12.0)
Neutro Abs: 3.4 10*3/uL (ref 1.4–7.7)
Neutrophils Relative %: 60 % (ref 43.0–77.0)
Platelets: 256 10*3/uL (ref 150.0–400.0)
RBC: 4.47 Mil/uL (ref 3.87–5.11)
RDW: 13 % (ref 11.5–15.5)
WBC: 5.7 10*3/uL (ref 4.0–10.5)

## 2017-01-17 LAB — LIPID PANEL
Cholesterol: 186 mg/dL (ref 0–200)
HDL: 57.6 mg/dL
LDL Cholesterol: 108 mg/dL — ABNORMAL HIGH (ref 0–99)
NonHDL: 128.44
Total CHOL/HDL Ratio: 3
Triglycerides: 104 mg/dL (ref 0.0–149.0)
VLDL: 20.8 mg/dL (ref 0.0–40.0)

## 2017-01-17 LAB — TSH: TSH: 4.73 u[IU]/mL — ABNORMAL HIGH (ref 0.35–4.50)

## 2017-01-20 ENCOUNTER — Ambulatory Visit (INDEPENDENT_AMBULATORY_CARE_PROVIDER_SITE_OTHER): Payer: 59 | Admitting: Family Medicine

## 2017-01-20 ENCOUNTER — Encounter: Payer: Self-pay | Admitting: Family Medicine

## 2017-01-20 VITALS — BP 126/68 | HR 76 | Temp 98.4°F | Ht 62.0 in | Wt 163.5 lb

## 2017-01-20 DIAGNOSIS — R946 Abnormal results of thyroid function studies: Secondary | ICD-10-CM | POA: Diagnosis not present

## 2017-01-20 DIAGNOSIS — Z0000 Encounter for general adult medical examination without abnormal findings: Secondary | ICD-10-CM

## 2017-01-20 DIAGNOSIS — Z0001 Encounter for general adult medical examination with abnormal findings: Secondary | ICD-10-CM | POA: Diagnosis not present

## 2017-01-20 DIAGNOSIS — Z1231 Encounter for screening mammogram for malignant neoplasm of breast: Secondary | ICD-10-CM

## 2017-01-20 DIAGNOSIS — R7989 Other specified abnormal findings of blood chemistry: Secondary | ICD-10-CM | POA: Insufficient documentation

## 2017-01-20 NOTE — Patient Instructions (Addendum)
For healthy weight:   Try to get most of your carbohydrates from produce (with the exception of white potatoes)  Eat less bread/pasta/rice/snack foods/cereals/sweets and other items from the middle of the grocery store (processed carbs)  Keep exercising -aim for at least 30 min per day   Do the ifob kit for colon cancer screening  Check with insurance about cologuard for next year  When ready we will schedule your colonoscopy   Don't forget to get your flu shot in the fall   We are going to watch your thyroid function - for hypothyroidism in the future   Stop at check out for mammogram referral    
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
## 2017-01-22 NOTE — Assessment & Plan Note (Addendum)
Reviewed health habits including diet and exercise and skin cancer prevention Reviewed appropriate screening tests for age  Also reviewed health mt list, fam hx and immunization status , as well as social and family history   See HPI Labs rev  Declines colonoscopy-will do ifob kit and call re; cov of cologuard in the future  Will watch thyroid over time and check FT4 next time  Ref for mammogram  Plan made for diet/exercise   

## 2017-01-22 NOTE — Assessment & Plan Note (Signed)
Mildly elevated Lab Results  Component Value Date   TSH 4.73 (H) 01/17/2017   No clinical changes  Will watch - and check FT4 next time Of note- there is fam hx of thyroid issues

## 2017-01-22 NOTE — Assessment & Plan Note (Signed)
Scheduled annual screening mammogram Nl breast exam today  Encouraged monthly self exams   

## 2019-01-20 ENCOUNTER — Telehealth: Payer: Self-pay | Admitting: Family Medicine

## 2019-01-20 DIAGNOSIS — R7989 Other specified abnormal findings of blood chemistry: Secondary | ICD-10-CM

## 2019-01-20 DIAGNOSIS — Z0000 Encounter for general adult medical examination without abnormal findings: Secondary | ICD-10-CM

## 2019-01-20 NOTE — Telephone Encounter (Signed)
-----   Message from Miranda Griffith J Walsh sent at 01/14/2019 12:26 PM EDT ----- Regarding: Lab orders for Monday 8.17.20 Patient is scheduled for CPX labs, please order future labs, Thanks , Miranda Griffith  

## 2019-01-21 ENCOUNTER — Other Ambulatory Visit: Payer: Self-pay

## 2019-01-21 ENCOUNTER — Other Ambulatory Visit (INDEPENDENT_AMBULATORY_CARE_PROVIDER_SITE_OTHER): Payer: 59

## 2019-01-21 DIAGNOSIS — Z0000 Encounter for general adult medical examination without abnormal findings: Secondary | ICD-10-CM | POA: Diagnosis not present

## 2019-01-21 DIAGNOSIS — R7989 Other specified abnormal findings of blood chemistry: Secondary | ICD-10-CM

## 2019-01-21 LAB — CBC WITH DIFFERENTIAL/PLATELET
Basophils Absolute: 0 10*3/uL (ref 0.0–0.1)
Basophils Relative: 0.6 % (ref 0.0–3.0)
Eosinophils Absolute: 0.2 10*3/uL (ref 0.0–0.7)
Eosinophils Relative: 3.6 % (ref 0.0–5.0)
HCT: 39.7 % (ref 36.0–46.0)
Hemoglobin: 13.2 g/dL (ref 12.0–15.0)
Lymphocytes Relative: 32.6 % (ref 12.0–46.0)
Lymphs Abs: 1.9 10*3/uL (ref 0.7–4.0)
MCHC: 33.3 g/dL (ref 30.0–36.0)
MCV: 88.3 fl (ref 78.0–100.0)
Monocytes Absolute: 0.4 10*3/uL (ref 0.1–1.0)
Monocytes Relative: 7.3 % (ref 3.0–12.0)
Neutro Abs: 3.3 10*3/uL (ref 1.4–7.7)
Neutrophils Relative %: 55.9 % (ref 43.0–77.0)
Platelets: 259 10*3/uL (ref 150.0–400.0)
RBC: 4.49 Mil/uL (ref 3.87–5.11)
RDW: 13.3 % (ref 11.5–15.5)
WBC: 6 10*3/uL (ref 4.0–10.5)

## 2019-01-21 LAB — COMPREHENSIVE METABOLIC PANEL
ALT: 40 U/L — ABNORMAL HIGH (ref 0–35)
AST: 25 U/L (ref 0–37)
Albumin: 4.3 g/dL (ref 3.5–5.2)
Alkaline Phosphatase: 106 U/L (ref 39–117)
BUN: 16 mg/dL (ref 6–23)
CO2: 27 mEq/L (ref 19–32)
Calcium: 9.4 mg/dL (ref 8.4–10.5)
Chloride: 104 mEq/L (ref 96–112)
Creatinine, Ser: 0.74 mg/dL (ref 0.40–1.20)
GFR: 81.99 mL/min (ref >60.00)
Glucose, Bld: 92 mg/dL (ref 70–99)
Potassium: 4.2 mEq/L (ref 3.5–5.1)
Sodium: 140 mEq/L (ref 135–145)
Total Bilirubin: 0.4 mg/dL (ref 0.2–1.2)
Total Protein: 7.4 g/dL (ref 6.0–8.3)

## 2019-01-21 LAB — LIPID PANEL
Cholesterol: 204 mg/dL — ABNORMAL HIGH (ref 0–200)
HDL: 60.5 mg/dL (ref >39.00)
LDL Cholesterol: 126 mg/dL — ABNORMAL HIGH (ref 0–99)
NonHDL: 143.17
Total CHOL/HDL Ratio: 3
Triglycerides: 84 mg/dL (ref 0.0–149.0)
VLDL: 16.8 mg/dL (ref 0.0–40.0)

## 2019-01-21 LAB — TSH: TSH: 3.51 u[IU]/mL (ref 0.35–4.50)

## 2019-01-21 LAB — T4, FREE: Free T4: 0.92 ng/dL (ref 0.60–1.60)

## 2019-01-28 ENCOUNTER — Other Ambulatory Visit (HOSPITAL_COMMUNITY)
Admission: RE | Admit: 2019-01-28 | Discharge: 2019-01-28 | Disposition: A | Discharge status: Home / Self Care | Payer: 59 | Source: Ambulatory Visit | Attending: Family Medicine | Admitting: Family Medicine

## 2019-01-28 ENCOUNTER — Encounter: Payer: Self-pay | Admitting: Family Medicine

## 2019-01-28 ENCOUNTER — Other Ambulatory Visit: Payer: Self-pay

## 2019-01-28 ENCOUNTER — Ambulatory Visit (INDEPENDENT_AMBULATORY_CARE_PROVIDER_SITE_OTHER): Payer: 59 | Admitting: Family Medicine

## 2019-01-28 VITALS — BP 128/72 | HR 92 | Temp 98.3°F | Ht 62.0 in | Wt 173.0 lb

## 2019-01-28 DIAGNOSIS — Z1231 Encounter for screening mammogram for malignant neoplasm of breast: Secondary | ICD-10-CM | POA: Diagnosis not present

## 2019-01-28 DIAGNOSIS — Z23 Encounter for immunization: Secondary | ICD-10-CM | POA: Diagnosis not present

## 2019-01-28 DIAGNOSIS — Z01419 Encounter for gynecological examination (general) (routine) without abnormal findings: Secondary | ICD-10-CM | POA: Diagnosis not present

## 2019-01-28 DIAGNOSIS — Z0000 Encounter for general adult medical examination without abnormal findings: Secondary | ICD-10-CM | POA: Diagnosis not present

## 2019-01-28 DIAGNOSIS — E669 Obesity, unspecified: Secondary | ICD-10-CM

## 2019-01-28 DIAGNOSIS — R7989 Other specified abnormal findings of blood chemistry: Secondary | ICD-10-CM | POA: Diagnosis not present

## 2019-01-28 NOTE — Patient Instructions (Addendum)
I placed referral for mammogram-you can call and schedule at [facility]   Please do the ifob kit for colon screening   For cholesterol Avoid red meat/ fried foods/ egg yolks/ fatty breakfast meats/ butter, cheese and high fat dairy/ and shellfish    Keep exercising Take care of yourself   Pap and gyn exam today   Flu shot and Tdap shot today

## 2019-01-28 NOTE — Assessment & Plan Note (Signed)
Reviewed health habits including diet and exercise and skin cancer prevention Reviewed appropriate screening tests for age  Also reviewed health mt list, fam hx and immunization status , as well as social and family history   See HPI Labs reviewed Tdap and flu shot given today  ifob kit given (pt declines colonoscopy) Wt loss with diet/exercise enc Gyn exam and pap done today

## 2019-01-28 NOTE — Assessment & Plan Note (Signed)
Mammogram ordered-pt will call to schedule  Nl exam today  Enc regular self exams

## 2019-01-28 NOTE — Assessment & Plan Note (Signed)
Discussed how this problem influences overall health and the risks it imposes  Reviewed plan for weight loss with lower calorie diet (via better food choices and also portion control or program like weight watchers) and exercise building up to or more than 30 minutes 5 days per week including some aerobic activity    
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
## 2019-01-28 NOTE — Assessment & Plan Note (Signed)
Lab Results  Component Value Date   TSH 3.51 01/21/2019   Nl today along with FT4

## 2019-01-28 NOTE — Assessment & Plan Note (Signed)
Routine exam w/o abn  Pap pending No c/o

## 2019-01-30 LAB — CYTOLOGY - PAP
Adequacy: ABSENT
Diagnosis: NEGATIVE

## 2019-02-19 ENCOUNTER — Other Ambulatory Visit (INDEPENDENT_AMBULATORY_CARE_PROVIDER_SITE_OTHER): Payer: 59

## 2019-02-19 ENCOUNTER — Other Ambulatory Visit: Payer: Self-pay | Admitting: Family Medicine

## 2019-02-19 DIAGNOSIS — Z1211 Encounter for screening for malignant neoplasm of colon: Secondary | ICD-10-CM | POA: Diagnosis not present

## 2019-02-19 LAB — FECAL OCCULT BLOOD, IMMUNOCHEMICAL: Fecal Occult Bld: NEGATIVE

## 2019-03-28 ENCOUNTER — Ambulatory Visit
Admission: RE | Admit: 2019-03-28 | Discharge: 2019-03-28 | Disposition: A | Discharge status: Home / Self Care | Payer: 59 | Source: Ambulatory Visit | Attending: Family Medicine | Admitting: Family Medicine

## 2019-03-28 DIAGNOSIS — Z1231 Encounter for screening mammogram for malignant neoplasm of breast: Secondary | ICD-10-CM | POA: Diagnosis not present

## 2019-10-10 ENCOUNTER — Encounter: Payer: Self-pay | Admitting: Family Medicine

## 2019-10-10 ENCOUNTER — Ambulatory Visit (INDEPENDENT_AMBULATORY_CARE_PROVIDER_SITE_OTHER): Payer: 59 | Admitting: Family Medicine

## 2019-10-10 ENCOUNTER — Other Ambulatory Visit: Payer: Self-pay

## 2019-10-10 DIAGNOSIS — S1096XA Insect bite of unspecified part of neck, initial encounter: Secondary | ICD-10-CM | POA: Diagnosis not present

## 2019-10-10 DIAGNOSIS — W57XXXA Bitten or stung by nonvenomous insect and other nonvenomous arthropods, initial encounter: Secondary | ICD-10-CM | POA: Diagnosis not present
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
## 2019-10-10 NOTE — Assessment & Plan Note (Signed)
Posterior neck/midline No remaining tick parts  Small area of induration/erythema w/o worrisome features One small reactive LN below it and to the L  Will watch this and alert if it does not go down Disc symptoms to watch for /tick bourne illness -see AVS Soap/water and cortisone cream otc ok  Monitor and alert [place] if changes

## 2019-10-10 NOTE — Patient Instructions (Addendum)
Your tick bite looks reassuring  Clean with soap and water  Use cortisone cream over the counter   Watch for enlarging area of redness or a target shape  Watch for rash anywhere Watch for fever / headache or other signs of illness and let us know   The bump is a reactive lymph node from the bite- this should go back down but if no improvement in a month let me know

## 2020-01-27 ENCOUNTER — Telehealth: Payer: Self-pay | Admitting: Family Medicine

## 2020-01-27 DIAGNOSIS — Z0000 Encounter for general adult medical examination without abnormal findings: Secondary | ICD-10-CM

## 2020-01-27 DIAGNOSIS — R7989 Other specified abnormal findings of blood chemistry: Secondary | ICD-10-CM

## 2020-01-27 NOTE — Telephone Encounter (Signed)
-----   Message from Aquilla Solian, RT sent at 01/14/2020  2:58 PM EDT ----- Regarding: Lab Orders for Tuesday 8.24.2021 Please place lab orders for Tuesday 8.24.2021, office visit for physical on Thursday 8.26.2021 Thank you, Jones Bales RT(R)

## 2020-01-28 ENCOUNTER — Other Ambulatory Visit: Payer: Self-pay

## 2020-01-28 ENCOUNTER — Other Ambulatory Visit (INDEPENDENT_AMBULATORY_CARE_PROVIDER_SITE_OTHER): Payer: 59

## 2020-01-28 DIAGNOSIS — R7989 Other specified abnormal findings of blood chemistry: Secondary | ICD-10-CM | POA: Diagnosis not present

## 2020-01-28 DIAGNOSIS — Z0000 Encounter for general adult medical examination without abnormal findings: Secondary | ICD-10-CM | POA: Diagnosis not present

## 2020-01-28 LAB — CBC WITH DIFFERENTIAL/PLATELET
Basophils Absolute: 0 10*3/uL (ref 0.0–0.1)
Basophils Relative: 0.3 % (ref 0.0–3.0)
Eosinophils Absolute: 0.1 10*3/uL (ref 0.0–0.7)
Eosinophils Relative: 2.2 % (ref 0.0–5.0)
HCT: 38 % (ref 36.0–46.0)
Hemoglobin: 12.9 g/dL (ref 12.0–15.0)
Lymphocytes Relative: 31.7 % (ref 12.0–46.0)
Lymphs Abs: 2 10*3/uL (ref 0.7–4.0)
MCHC: 33.9 g/dL (ref 30.0–36.0)
MCV: 86.7 fl (ref 78.0–100.0)
Monocytes Absolute: 0.4 10*3/uL (ref 0.1–1.0)
Monocytes Relative: 6.3 % (ref 3.0–12.0)
Neutro Abs: 3.7 10*3/uL (ref 1.4–7.7)
Neutrophils Relative %: 59.5 % (ref 43.0–77.0)
Platelets: 234 10*3/uL (ref 150.0–400.0)
RBC: 4.38 Mil/uL (ref 3.87–5.11)
RDW: 13.3 % (ref 11.5–15.5)
WBC: 6.2 10*3/uL (ref 4.0–10.5)

## 2020-01-28 LAB — COMPREHENSIVE METABOLIC PANEL
ALT: 32 U/L (ref 0–35)
AST: 19 U/L (ref 0–37)
Albumin: 4.1 g/dL (ref 3.5–5.2)
Alkaline Phosphatase: 106 U/L (ref 39–117)
BUN: 13 mg/dL (ref 6–23)
CO2: 29 mEq/L (ref 19–32)
Calcium: 9.5 mg/dL (ref 8.4–10.5)
Chloride: 104 mEq/L (ref 96–112)
Creatinine, Ser: 0.75 mg/dL (ref 0.40–1.20)
GFR: 80.42 mL/min (ref >60.00)
Glucose, Bld: 91 mg/dL (ref 70–99)
Potassium: 4.2 mEq/L (ref 3.5–5.1)
Sodium: 140 mEq/L (ref 135–145)
Total Bilirubin: 0.6 mg/dL (ref 0.2–1.2)
Total Protein: 7.1 g/dL (ref 6.0–8.3)

## 2020-01-28 LAB — LIPID PANEL
Cholesterol: 203 mg/dL — ABNORMAL HIGH (ref 0–200)
HDL: 55.7 mg/dL (ref >39.00)
LDL Cholesterol: 129 mg/dL — ABNORMAL HIGH (ref 0–99)
NonHDL: 147.59
Total CHOL/HDL Ratio: 4
Triglycerides: 91 mg/dL (ref 0.0–149.0)
VLDL: 18.2 mg/dL (ref 0.0–40.0)

## 2020-01-28 LAB — TSH: TSH: 4.31 u[IU]/mL (ref 0.35–4.50)

## 2020-01-30 ENCOUNTER — Ambulatory Visit (INDEPENDENT_AMBULATORY_CARE_PROVIDER_SITE_OTHER): Payer: 59 | Admitting: Family Medicine

## 2020-01-30 ENCOUNTER — Other Ambulatory Visit: Payer: Self-pay

## 2020-01-30 ENCOUNTER — Encounter: Payer: Self-pay | Admitting: Family Medicine

## 2020-01-30 VITALS — BP 132/70 | HR 74 | Temp 97.5°F | Ht 62.0 in | Wt 167.4 lb

## 2020-01-30 DIAGNOSIS — Z1211 Encounter for screening for malignant neoplasm of colon: Secondary | ICD-10-CM | POA: Diagnosis not present

## 2020-01-30 DIAGNOSIS — E7800 Pure hypercholesterolemia, unspecified: Secondary | ICD-10-CM

## 2020-01-30 DIAGNOSIS — Z0000 Encounter for general adult medical examination without abnormal findings: Secondary | ICD-10-CM | POA: Diagnosis not present

## 2020-01-30 DIAGNOSIS — E669 Obesity, unspecified: Secondary | ICD-10-CM | POA: Diagnosis not present

## 2020-01-30 DIAGNOSIS — E785 Hyperlipidemia, unspecified: Secondary | ICD-10-CM | POA: Insufficient documentation

## 2020-01-30 NOTE — Patient Instructions (Addendum)
If you are interested in the shingles vaccine series (Shingrix), call your insurance or pharmacy to check on coverage and location it must be given.  If affordable - you can schedule it here or at your pharmacy depending on coverage   Get your flu shot in the fall   Please do the ifob kit for colon cancer screening   Mammogram will be due in October   For cholesterol  Avoid red meat/ fried foods/ egg yolks/ fatty breakfast meats/ butter, cheese and high fat dairy/ and shellfish    For bone health add 1000 iu of vitamin D    

## 2020-01-30 NOTE — Assessment & Plan Note (Signed)
Disc goals for lipids and reasons to control them Rev last labs with pt Rev low sat fat diet in detail Some fam hx of CAD If no further imp in LDL in the future-would consider a statin  Diet is good Will cut back further on fatty dairy products

## 2020-01-30 NOTE — Assessment & Plan Note (Signed)
Commended on wt lost so far  Discussed how this problem influences overall health and the risks it imposes  Reviewed plan for weight loss with lower calorie diet (via better food choices and also portion control or program like weight watchers) and exercise building up to or more than 30 minutes 5 days per week including some aerobic activity

## 2020-01-30 NOTE — Assessment & Plan Note (Signed)
Reviewed health habits including diet and exercise and skin cancer prevention Reviewed appropriate screening tests for age  Also reviewed health mt list, fam hx and immunization status , as well as social and family history   See HPI Labs reviewed  covid immunized  Disc the shingrix vaccine  Disc adding more vit D for bone health  Will get a flu shot in the fall

## 2020-01-30 NOTE — Assessment & Plan Note (Signed)
Pt declines colonoscopy  Given ifob kit
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
## 2020-02-07 ENCOUNTER — Other Ambulatory Visit: Payer: Self-pay | Admitting: Family Medicine

## 2020-02-07 ENCOUNTER — Other Ambulatory Visit (INDEPENDENT_AMBULATORY_CARE_PROVIDER_SITE_OTHER): Payer: 59

## 2020-02-07 DIAGNOSIS — Z1211 Encounter for screening for malignant neoplasm of colon: Secondary | ICD-10-CM

## 2020-02-07 LAB — FECAL OCCULT BLOOD, IMMUNOCHEMICAL: Fecal Occult Bld: NEGATIVE

## 2020-02-11 ENCOUNTER — Telehealth: Payer: Self-pay

## 2020-02-11 NOTE — Telephone Encounter (Signed)
-----  Message from Abner Greenspan, MD sent at 02/09/2020  3:21 PM EDT ----- Negative ifob kit

## 2020-03-20 ENCOUNTER — Encounter: Payer: Self-pay | Admitting: Family Medicine

## 2020-09-01 ENCOUNTER — Other Ambulatory Visit: Payer: Self-pay | Admitting: Family Medicine

## 2020-11-05 ENCOUNTER — Ambulatory Visit (INDEPENDENT_AMBULATORY_CARE_PROVIDER_SITE_OTHER): Payer: 59 | Admitting: Family Medicine

## 2020-11-05 ENCOUNTER — Encounter: Payer: Self-pay | Admitting: Family Medicine

## 2020-11-05 ENCOUNTER — Other Ambulatory Visit: Payer: Self-pay

## 2020-11-05 ENCOUNTER — Telehealth: Payer: Self-pay

## 2020-11-05 VITALS — BP 106/68 | HR 77 | Temp 97.0°F | Ht 62.0 in | Wt 174.2 lb

## 2020-11-05 DIAGNOSIS — N644 Mastodynia: Secondary | ICD-10-CM

## 2020-11-05 DIAGNOSIS — N6011 Diffuse cystic mastopathy of right breast: Secondary | ICD-10-CM | POA: Diagnosis not present

## 2020-11-05 DIAGNOSIS — Z1231 Encounter for screening mammogram for malignant neoplasm of breast: Secondary | ICD-10-CM

## 2020-11-05 DIAGNOSIS — Z8616 Personal history of COVID-19: Secondary | ICD-10-CM | POA: Insufficient documentation

## 2020-11-05 DIAGNOSIS — N6012 Diffuse cystic mastopathy of left breast: Secondary | ICD-10-CM | POA: Diagnosis not present

## 2020-11-05 NOTE — Patient Instructions (Signed)
I ordered mammogram and ultrasound  You will get a call to schedule this   If symptoms change let us know

## 2020-11-05 NOTE — Assessment & Plan Note (Signed)
With nl exam  Suspect due to fibrocystic change (has had bx in the past)  diag mm and [place] ordered for [place] Will update with results Enc to avoid caffeine (she only has one serving per day in am)  Wear supportive bra esp for exercise

## 2020-11-05 NOTE — Telephone Encounter (Signed)
Dense breast tissue  Has had biopsy/cyst in the past but not now  Thank's for changing the order to screening

## 2020-11-05 NOTE — Assessment & Plan Note (Signed)
Some L sided breast pain  Reassuring exam Imaging ordered
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
## 2020-11-05 NOTE — Telephone Encounter (Signed)
Spoke with [facility], [place], in regards to the orders for mammogram and Ultrasound. Per Asher Muir since there is not a certain area of the breast that is source of pain and is just left breast pain all over this order needs to be changed to routine screening mammogram. I pulled down the order that Asher Muir gave me to sign.  I did advise Asher Muir that per patient more intense pain is in the lower part of the left breast but since it was not a particular spot order needs to be changed. Per Asher Muir if any concerns are seen on the screening mammogram they will get additional imaging done after that.   Dr Milinda Antis, also Asher Muir wanted me to clarify with you if patient was having fibrocystic changes and if so what changes? This note was added into the original order but could not tell per note if anything new was identified on the exam. Per patient she was not aware of any breast changes besides the pain.  Let me know if this is confusing and I can speak with you in the office.  Patient is aware.

## 2020-11-05 NOTE — Assessment & Plan Note (Signed)
In November Fully recovered Had 2 shots Due for booster-enc to get that

## 2020-11-06 NOTE — Telephone Encounter (Signed)
Dr Milinda Antis when you able to please co sign the order and then we can schedule. Thank you

## 2020-11-06 NOTE — Telephone Encounter (Signed)
done

## 2020-11-11 ENCOUNTER — Other Ambulatory Visit: Payer: Self-pay

## 2020-11-11 ENCOUNTER — Ambulatory Visit
Admission: RE | Admit: 2020-11-11 | Discharge: 2020-11-11 | Disposition: A | Discharge status: Home / Self Care | Payer: 59 | Source: Ambulatory Visit | Attending: Family Medicine | Admitting: Family Medicine

## 2020-11-11 DIAGNOSIS — N644 Mastodynia: Secondary | ICD-10-CM | POA: Insufficient documentation

## 2020-11-11 DIAGNOSIS — Z1231 Encounter for screening mammogram for malignant neoplasm of breast: Secondary | ICD-10-CM | POA: Diagnosis not present

## 2021-08-18 ENCOUNTER — Other Ambulatory Visit: Payer: Self-pay | Admitting: Nurse Practitioner

## 2021-08-18 ENCOUNTER — Ambulatory Visit (INDEPENDENT_AMBULATORY_CARE_PROVIDER_SITE_OTHER): Payer: 59 | Admitting: Nurse Practitioner

## 2021-08-18 ENCOUNTER — Encounter: Payer: Self-pay | Admitting: Nurse Practitioner

## 2021-08-18 ENCOUNTER — Other Ambulatory Visit: Payer: Self-pay

## 2021-08-18 VITALS — BP 128/76 | HR 112 | Temp 100.5°F | Resp 14 | Ht 62.0 in | Wt 174.6 lb

## 2021-08-18 DIAGNOSIS — J029 Acute pharyngitis, unspecified: Secondary | ICD-10-CM | POA: Insufficient documentation

## 2021-08-18 DIAGNOSIS — J020 Streptococcal pharyngitis: Secondary | ICD-10-CM | POA: Diagnosis not present

## 2021-08-18 DIAGNOSIS — R509 Fever, unspecified: Secondary | ICD-10-CM | POA: Insufficient documentation

## 2021-08-18 DIAGNOSIS — R0981 Nasal congestion: Secondary | ICD-10-CM | POA: Insufficient documentation

## 2021-08-18 DIAGNOSIS — R0982 Postnasal drip: Secondary | ICD-10-CM | POA: Diagnosis not present

## 2021-08-18 DIAGNOSIS — R5081 Fever presenting with conditions classified elsewhere: Secondary | ICD-10-CM | POA: Diagnosis not present

## 2021-08-18 LAB — POC COVID19 BINAXNOW: SARS Coronavirus 2 Ag: NEGATIVE

## 2021-08-18 LAB — POCT RAPID STREP A (OFFICE): Rapid Strep A Screen: POSITIVE — AB

## 2021-08-18 LAB — POCT INFLUENZA A/B
Influenza A, POC: NEGATIVE
Influenza B, POC: NEGATIVE

## 2021-08-18 MED ORDER — AMOXICILLIN-POT CLAVULANATE 875-125 MG PO TABS: 1.0000 | ORAL_TABLET | Freq: Two times a day (BID) | ORAL | 0 refills | Status: AC

## 2021-08-18 NOTE — Assessment & Plan Note (Signed)
Patient's POC strep test came back positive.  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  Her COVID and flu test were both negative in office.  We will like to treat with Augmentin 875-125 mg twice daily for 7 days.  Patient winced when doing her sinus exam in her frontal sinuses wanted to have additional coverage so chose Augmentin. ?

## 2021-08-18 NOTE — Assessment & Plan Note (Addendum)
From strep infection.  Patient to rest, hydrate, and use over-the-counter analgesics as needed for fever and pain control.  Did offer patient a note for employment but she did not declined as she states she does not need one.  Offered to administer Toradol IM injection to patient office.  Patient politely declined ?
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
## 2021-08-18 NOTE — Assessment & Plan Note (Signed)
Did recommend patient using fluticasone nasal spray 2 sprays each nostril once daily.  Patient states she has medication at home and is not a prescription.  Did discuss precautions with fluticasone in regards to inciting epistaxis. ?

## 2021-08-18 NOTE — Patient Instructions (Signed)
Nice to see you today ?You can use warm salt water gargle, cough drops, and over the counter tylenol/ibuprofen for sore throat and for fevers. ?Follow up if no improvement or symptoms get worse ?

## 2021-08-18 NOTE — Assessment & Plan Note (Signed)
Strep test positive, COVID test and flu test negative in office. ?

## 2021-11-22 ENCOUNTER — Ambulatory Visit (INDEPENDENT_AMBULATORY_CARE_PROVIDER_SITE_OTHER): Payer: 59 | Admitting: Family Medicine

## 2021-11-22 ENCOUNTER — Encounter: Payer: Self-pay | Admitting: Family Medicine

## 2021-11-22 VITALS — BP 118/78 | HR 80 | Ht 63.0 in | Wt 174.4 lb

## 2021-11-22 DIAGNOSIS — Z0000 Encounter for general adult medical examination without abnormal findings: Secondary | ICD-10-CM

## 2021-11-22 DIAGNOSIS — Z1231 Encounter for screening mammogram for malignant neoplasm of breast: Secondary | ICD-10-CM

## 2021-11-22 DIAGNOSIS — E7800 Pure hypercholesterolemia, unspecified: Secondary | ICD-10-CM | POA: Diagnosis not present

## 2021-11-22 DIAGNOSIS — Z1211 Encounter for screening for malignant neoplasm of colon: Secondary | ICD-10-CM | POA: Diagnosis not present

## 2021-11-22 DIAGNOSIS — E669 Obesity, unspecified: Secondary | ICD-10-CM

## 2021-11-22 LAB — COMPREHENSIVE METABOLIC PANEL
ALT: 29 U/L (ref 0–35)
AST: 31 U/L (ref 0–37)
Albumin: 4.3 g/dL (ref 3.5–5.2)
Alkaline Phosphatase: 88 U/L (ref 39–117)
BUN: 14 mg/dL (ref 6–23)
CO2: 29 mEq/L (ref 19–32)
Calcium: 9.5 mg/dL (ref 8.4–10.5)
Chloride: 105 mEq/L (ref 96–112)
Creatinine, Ser: 0.76 mg/dL (ref 0.40–1.20)
GFR: 87.75 mL/min (ref >60.00)
Glucose, Bld: 93 mg/dL (ref 70–99)
Potassium: 4.3 mEq/L (ref 3.5–5.1)
Sodium: 139 mEq/L (ref 135–145)
Total Bilirubin: 0.4 mg/dL (ref 0.2–1.2)
Total Protein: 7.3 g/dL (ref 6.0–8.3)

## 2021-11-22 LAB — CBC WITH DIFFERENTIAL/PLATELET
Basophils Absolute: 0 10*3/uL (ref 0.0–0.1)
Basophils Relative: 0.6 % (ref 0.0–3.0)
Eosinophils Absolute: 0.2 10*3/uL (ref 0.0–0.7)
Eosinophils Relative: 3.3 % (ref 0.0–5.0)
HCT: 39 % (ref 36.0–46.0)
Hemoglobin: 13 g/dL (ref 12.0–15.0)
Lymphocytes Relative: 35.1 % (ref 12.0–46.0)
Lymphs Abs: 1.8 10*3/uL (ref 0.7–4.0)
MCHC: 33.3 g/dL (ref 30.0–36.0)
MCV: 86.7 fl (ref 78.0–100.0)
Monocytes Absolute: 0.3 10*3/uL (ref 0.1–1.0)
Monocytes Relative: 6.9 % (ref 3.0–12.0)
Neutro Abs: 2.7 10*3/uL (ref 1.4–7.7)
Neutrophils Relative %: 54.1 % (ref 43.0–77.0)
Platelets: 237 10*3/uL (ref 150.0–400.0)
RBC: 4.49 Mil/uL (ref 3.87–5.11)
RDW: 13.6 % (ref 11.5–15.5)
WBC: 5 10*3/uL (ref 4.0–10.5)

## 2021-11-22 LAB — LIPID PANEL
Cholesterol: 214 mg/dL — ABNORMAL HIGH (ref 0–200)
HDL: 64.6 mg/dL (ref >39.00)
LDL Cholesterol: 138 mg/dL — ABNORMAL HIGH (ref 0–99)
NonHDL: 149.77
Total CHOL/HDL Ratio: 3
Triglycerides: 58 mg/dL (ref 0.0–149.0)
VLDL: 11.6 mg/dL (ref 0.0–40.0)

## 2021-11-22 LAB — TSH: TSH: 3.27 u[IU]/mL (ref 0.35–5.50)

## 2021-11-22 NOTE — Assessment & Plan Note (Signed)
Disc goals for lipids and reasons to control them Rev last labs with pt Rev low sat fat diet in detail  Lab today 

## 2021-11-22 NOTE — Assessment & Plan Note (Signed)
Reviewed health habits including diet and exercise and skin cancer prevention Reviewed appropriate screening tests for age  Also reviewed health mt list, fam hx and immunization status , as well as social and family history   See HPI Labs ordered  Mammogram ordered, pt will call to schedule  Interested in [place] will call to see if it is covered Pap is utd/no h/o HPV and no new partners Taking D3 for bone health, enc more exercise  cologuard kit ordered for colon screen, pt declines a colonoscopy
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
## 2021-11-22 NOTE — Assessment & Plan Note (Signed)
Discussed how this problem influences overall health and the risks it imposes  Reviewed plan for weight loss with lower calorie diet (via better food choices and also portion control or program like weight watchers) and exercise building up to or more than 30 minutes 5 days per week including some aerobic activity    

## 2021-11-22 NOTE — Patient Instructions (Addendum)
Think about adding exercise- 30 minutes per day   If you are interested in the shingles vaccine series (Shingrix), call your insurance or pharmacy to check on coverage and location it must be given.  If affordable - you can schedule it here or at your pharmacy depending on coverage   Think about a flu shot in the fall    For cholesterol  Avoid red meat/ fried foods/ egg yolks/ fatty breakfast meats/ butter, cheese and high fat dairy/ and shellfish   Labs today

## 2021-11-22 NOTE — Assessment & Plan Note (Signed)
Nl exam today Encouraged self exams Mammogram ordered, pt will call to schedule

## 2021-11-22 NOTE — Assessment & Plan Note (Signed)
Declines colonoscopy  Discussed pros/cons  cologuard ordered  inst to call if she does not get email in next 2 wk

## 2021-12-13 LAB — COLOGUARD: COLOGUARD: NEGATIVE

## 2021-12-14 ENCOUNTER — Telehealth: Payer: Self-pay

## 2021-12-14 NOTE — Telephone Encounter (Signed)
I left a message for the patient to return my call.

## 2021-12-14 NOTE — Telephone Encounter (Signed)
-----   Message from Judy Pimple, MD sent at 12/13/2021  7:45 PM EDT ----- Cologuard test is negative  This is good for 3 years

## 2021-12-17 ENCOUNTER — Ambulatory Visit
Admission: RE | Admit: 2021-12-17 | Discharge: 2021-12-17 | Disposition: A | Discharge status: Home / Self Care | Payer: 59 | Source: Ambulatory Visit | Attending: Family Medicine | Admitting: Family Medicine

## 2021-12-17 DIAGNOSIS — Z1231 Encounter for screening mammogram for malignant neoplasm of breast: Secondary | ICD-10-CM | POA: Insufficient documentation

## 2021-12-20 ENCOUNTER — Other Ambulatory Visit: Payer: Self-pay | Admitting: Family Medicine

## 2021-12-20 DIAGNOSIS — N63 Unspecified lump in unspecified breast: Secondary | ICD-10-CM

## 2021-12-20 DIAGNOSIS — R928 Other abnormal and inconclusive findings on diagnostic imaging of breast: Secondary | ICD-10-CM

## 2022-01-06 ENCOUNTER — Other Ambulatory Visit: Payer: Self-pay | Admitting: Family Medicine

## 2022-01-06 ENCOUNTER — Ambulatory Visit
Admission: RE | Admit: 2022-01-06 | Discharge: 2022-01-06 | Disposition: A | Discharge status: Home / Self Care | Payer: 59 | Source: Ambulatory Visit | Attending: Family Medicine | Admitting: Family Medicine

## 2022-01-06 DIAGNOSIS — N63 Unspecified lump in unspecified breast: Secondary | ICD-10-CM

## 2022-01-06 DIAGNOSIS — R928 Other abnormal and inconclusive findings on diagnostic imaging of breast: Secondary | ICD-10-CM

## 2022-01-19 ENCOUNTER — Ambulatory Visit
Admission: RE | Admit: 2022-01-19 | Discharge: 2022-01-19 | Disposition: A | Discharge status: Home / Self Care | Payer: 59 | Source: Ambulatory Visit | Attending: Family Medicine | Admitting: Family Medicine

## 2022-01-19 DIAGNOSIS — N6022 Fibroadenosis of left breast: Secondary | ICD-10-CM | POA: Diagnosis present

## 2022-01-19 DIAGNOSIS — N63 Unspecified lump in unspecified breast: Secondary | ICD-10-CM

## 2022-01-19 DIAGNOSIS — R928 Other abnormal and inconclusive findings on diagnostic imaging of breast: Secondary | ICD-10-CM

## 2022-01-20 LAB — SURGICAL PATHOLOGY

## 2022-06-24 ENCOUNTER — Telehealth: Payer: 59 | Admitting: Family Medicine

## 2022-06-24 DIAGNOSIS — J019 Acute sinusitis, unspecified: Secondary | ICD-10-CM

## 2022-06-24 DIAGNOSIS — B9689 Other specified bacterial agents as the cause of diseases classified elsewhere: Secondary | ICD-10-CM

## 2022-06-24 MED ORDER — AMOXICILLIN-POT CLAVULANATE 875-125 MG PO TABS: 1.0000 | ORAL_TABLET | Freq: Two times a day (BID) | ORAL | 0 refills | Status: DC

## 2022-06-24 MED ORDER — AMOXICILLIN-POT CLAVULANATE 875-125 MG PO TABS: 1.0000 | ORAL_TABLET | Freq: Two times a day (BID) | ORAL | 0 refills | Status: AC

## 2022-06-24 NOTE — Addendum Note (Signed)
Addended by: Dellia Nims on: 06/24/2022 04:02 PM   Modules accepted: Orders

## 2022-06-24 NOTE — Progress Notes (Signed)

## 2023-01-01 ENCOUNTER — Telehealth: Payer: Self-pay | Admitting: Family Medicine

## 2023-01-01 DIAGNOSIS — E7800 Pure hypercholesterolemia, unspecified: Secondary | ICD-10-CM

## 2023-01-01 DIAGNOSIS — Z0000 Encounter for general adult medical examination without abnormal findings: Secondary | ICD-10-CM

## 2023-01-01 DIAGNOSIS — R7989 Other specified abnormal findings of blood chemistry: Secondary | ICD-10-CM

## 2023-01-01 NOTE — Telephone Encounter (Signed)
-----   Message from Lovena Neighbours sent at 12/15/2022  2:25 PM EDT ----- Regarding: Labs 7.30.24 Please put physical lab orders in future. Thank you, Denny Peon

## 2023-01-01 NOTE — Telephone Encounter (Signed)
-----   Message from Lovena Neighbours sent at 12/15/2022  2:57 PM EDT ----- Regarding: Labs for 7.30.24 Please put physical lab orders in future. Thank you, Denny Peon

## 2023-01-03 ENCOUNTER — Other Ambulatory Visit: Payer: 59

## 2023-01-04 ENCOUNTER — Encounter (INDEPENDENT_AMBULATORY_CARE_PROVIDER_SITE_OTHER): Payer: Self-pay

## 2023-01-10 ENCOUNTER — Other Ambulatory Visit (INDEPENDENT_AMBULATORY_CARE_PROVIDER_SITE_OTHER): Payer: 59

## 2023-01-10 ENCOUNTER — Encounter: Payer: 59 | Admitting: Family Medicine

## 2023-01-10 DIAGNOSIS — R7989 Other specified abnormal findings of blood chemistry: Secondary | ICD-10-CM

## 2023-01-10 DIAGNOSIS — Z0000 Encounter for general adult medical examination without abnormal findings: Secondary | ICD-10-CM

## 2023-01-10 DIAGNOSIS — E7800 Pure hypercholesterolemia, unspecified: Secondary | ICD-10-CM
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
## 2023-01-25 ENCOUNTER — Ambulatory Visit (INDEPENDENT_AMBULATORY_CARE_PROVIDER_SITE_OTHER): Payer: 59 | Admitting: Family Medicine

## 2023-01-25 ENCOUNTER — Encounter: Payer: Self-pay | Admitting: Family Medicine

## 2023-01-25 VITALS — BP 132/80 | HR 81 | Temp 97.8°F | Ht 61.75 in | Wt 171.1 lb

## 2023-01-25 DIAGNOSIS — Z0000 Encounter for general adult medical examination without abnormal findings: Secondary | ICD-10-CM

## 2023-01-25 DIAGNOSIS — E669 Obesity, unspecified: Secondary | ICD-10-CM

## 2023-01-25 DIAGNOSIS — E7800 Pure hypercholesterolemia, unspecified: Secondary | ICD-10-CM | POA: Diagnosis not present

## 2023-01-25 DIAGNOSIS — R0981 Nasal congestion: Secondary | ICD-10-CM

## 2023-01-25 DIAGNOSIS — Z1231 Encounter for screening mammogram for malignant neoplasm of breast: Secondary | ICD-10-CM

## 2023-01-25 DIAGNOSIS — Z1211 Encounter for screening for malignant neoplasm of colon: Secondary | ICD-10-CM | POA: Diagnosis not present

## 2023-01-25 DIAGNOSIS — R7989 Other specified abnormal findings of blood chemistry: Secondary | ICD-10-CM

## 2023-01-25 MED ORDER — FLUTICASONE PROPIONATE 50 MCG/ACT NA SUSP: 2.0000 | Freq: Every day | NASAL | 6 refills | Status: DC

## 2023-01-25 NOTE — Assessment & Plan Note (Signed)
Mammogram orderd

## 2023-01-25 NOTE — Patient Instructions (Addendum)
Try generic flonase for congestion   Consider a flu shot and covid booster in the fall   If you are interested in the new shingles vaccine (Shingrix) - call your local pharmacy to check on coverage and availability  If affordable, get on a wait list at your pharmacy to get the vaccine.   Try and exercise regularly  Add some strength training to your routine, this is important for bone and brain health and can reduce your risk of falls and help your body use insulin properly and regulate weight  Light weights, exercise bands , and internet videos are a good way to start  Yoga (chair or regular), machines , floor exercises or a gym with machines are also good options    Make sure you are getting at least 2000 international units vitamin D3 for your bones   Try to get most of your carbohydrates from produce (with the exception of white potatoes) and whole grains Eat less bread/pasta/rice/snack foods/cereals/sweets and other items from the middle of the grocery store (processed carbs)   Exercise will help sleep  Less caffeine is better  Melatonin or benadryl can help sleep      Please schedule your mammogram  You have an order for:  []   2D Mammogram  [x]   3D Mammogram  []   Bone Density     Please call for appointment:   [x]   [facility]  [address] [place] [address]  [phone]  []   [facility])   [address]  [place], [place] [address]  [phone]  []   [facility]      [address] [place], [place]        [phone]         []   [facility]  [address] [place], [place]  [phone]  []  [facility]   [address]   [place], [place] [address]  [phone]  []  [facility]  [address] [place], [place] [address] [phone]    Make sure to wear two piece clothing  No lotions powders or deodorants the day of the appointment Make sure to bring picture ID and insurance card.  Bring list of medications you are currently taking including any supplements.   Schedule your screening mammogram through MyChart!   Select [facility] imaging sites can now be scheduled through MyChart.  Log into your MyChart account.  Go to 'Visit' (or 'Appointments' if  on mobile App) --> Schedule an  Appointment  Under 'Select a Reason for Visit' choose the Mammogram  Screening option.  Complete the pre-visit questions  and select the time and place that  best fits your schedule

## 2023-01-25 NOTE — Assessment & Plan Note (Signed)
 Discussed how this problem influences overall health and the risks it imposes  Reviewed plan for weight loss with lower calorie diet (via better food choices (lower glycemic and portion control) along with exercise building up to or more than 30 minutes 5 days per week including some aerobic activity and strength training

## 2023-01-25 NOTE — Assessment & Plan Note (Signed)
Cologuard negative 12/2021

## 2023-01-25 NOTE — Assessment & Plan Note (Signed)
Disc goals for lipids and reasons to control them Rev last labs with pt Rev low sat fat diet in detail Fairly stable Encouraged exercise and omega 3 to increase HDL

## 2023-01-25 NOTE — Assessment & Plan Note (Signed)
Having more congestion recently  Sent flonase to pharmacy  Will update  Watch for signs and symptoms of bacterial sinusitis

## 2023-01-25 NOTE — Assessment & Plan Note (Signed)
Lab Results  Component Value Date   TSH 2.63 01/10/2023   Normal now

## 2023-01-25 NOTE — Assessment & Plan Note (Signed)
Reviewed health habits including diet and exercise and skin cancer prevention Reviewed appropriate screening tests for age  Also reviewed health mt list, fam hx and immunization status , as well as social and family history   See HPI Labs reviewed and ordered Encouraged flu shot in fall Encouraged shingrix vaccine/considering  Discussed bone health / vit D /strength training and fall prevention PHQ increase due to less sleep and fatigue/ discussed sleep hygiene

## 2023-02-17 ENCOUNTER — Ambulatory Visit
Admission: RE | Admit: 2023-02-17 | Discharge: 2023-02-17 | Disposition: A | Discharge status: Home / Self Care | Payer: 59 | Source: Ambulatory Visit | Attending: Family Medicine | Admitting: Family Medicine

## 2023-02-17 DIAGNOSIS — Z1231 Encounter for screening mammogram for malignant neoplasm of breast: Secondary | ICD-10-CM | POA: Insufficient documentation

## 2024-03-20 ENCOUNTER — Telehealth: Admitting: Physician Assistant

## 2024-03-20 DIAGNOSIS — R3989 Other symptoms and signs involving the genitourinary system: Secondary | ICD-10-CM

## 2024-03-21 MED ORDER — CEPHALEXIN 500 MG PO CAPS: 500.0000 mg | ORAL_CAPSULE | Freq: Two times a day (BID) | ORAL | 0 refills | Status: DC

## 2024-03-21 NOTE — Progress Notes (Signed)

## 2024-05-13 ENCOUNTER — Telehealth: Admitting: Family Medicine

## 2024-05-13 DIAGNOSIS — B9689 Other specified bacterial agents as the cause of diseases classified elsewhere: Secondary | ICD-10-CM

## 2024-05-13 MED ORDER — AMOXICILLIN-POT CLAVULANATE 875-125 MG PO TABS: 1.0000 | ORAL_TABLET | Freq: Two times a day (BID) | ORAL | 0 refills | Status: AC

## 2024-05-13 NOTE — Progress Notes (Signed)

## 2024-05-21 ENCOUNTER — Ambulatory Visit: Payer: Self-pay | Admitting: Family Medicine

## 2024-05-21 ENCOUNTER — Ambulatory Visit: Admitting: Family Medicine

## 2024-05-21 ENCOUNTER — Encounter: Payer: Self-pay | Admitting: Family Medicine

## 2024-05-21 VITALS — BP 134/86 | HR 84 | Temp 97.7°F | Ht 61.75 in | Wt 176.2 lb

## 2024-05-21 DIAGNOSIS — E669 Obesity, unspecified: Secondary | ICD-10-CM

## 2024-05-21 DIAGNOSIS — Z1231 Encounter for screening mammogram for malignant neoplasm of breast: Secondary | ICD-10-CM

## 2024-05-21 DIAGNOSIS — Z1159 Encounter for screening for other viral diseases: Secondary | ICD-10-CM

## 2024-05-21 DIAGNOSIS — Z1211 Encounter for screening for malignant neoplasm of colon: Secondary | ICD-10-CM

## 2024-05-21 DIAGNOSIS — Z131 Encounter for screening for diabetes mellitus: Secondary | ICD-10-CM

## 2024-05-21 DIAGNOSIS — Z0000 Encounter for general adult medical examination without abnormal findings: Secondary | ICD-10-CM | POA: Diagnosis not present

## 2024-05-21 DIAGNOSIS — Z114 Encounter for screening for human immunodeficiency virus [HIV]: Secondary | ICD-10-CM | POA: Diagnosis not present

## 2024-05-21 DIAGNOSIS — E7800 Pure hypercholesterolemia, unspecified: Secondary | ICD-10-CM

## 2024-05-21 DIAGNOSIS — L989 Disorder of the skin and subcutaneous tissue, unspecified: Secondary | ICD-10-CM | POA: Diagnosis not present

## 2024-05-21 DIAGNOSIS — R7303 Prediabetes: Secondary | ICD-10-CM | POA: Insufficient documentation

## 2024-05-21 LAB — CBC WITH DIFFERENTIAL/PLATELET
Basophils Absolute: 0 K/uL (ref 0.0–0.1)
Basophils Relative: 0.6 % (ref 0.0–3.0)
Eosinophils Absolute: 0.1 K/uL (ref 0.0–0.7)
Eosinophils Relative: 1.5 % (ref 0.0–5.0)
HCT: 39.5 % (ref 36.0–46.0)
Hemoglobin: 13.3 g/dL (ref 12.0–15.0)
Lymphocytes Relative: 33.3 % (ref 12.0–46.0)
Lymphs Abs: 2 K/uL (ref 0.7–4.0)
MCHC: 33.6 g/dL (ref 30.0–36.0)
MCV: 86.3 fl (ref 78.0–100.0)
Monocytes Absolute: 0.3 K/uL (ref 0.1–1.0)
Monocytes Relative: 5.8 % (ref 3.0–12.0)
Neutro Abs: 3.5 K/uL (ref 1.4–7.7)
Neutrophils Relative %: 58.8 % (ref 43.0–77.0)
Platelets: 276 K/uL (ref 150.0–400.0)
RBC: 4.58 Mil/uL (ref 3.87–5.11)
RDW: 12.9 % (ref 11.5–15.5)
WBC: 5.9 K/uL (ref 4.0–10.5)

## 2024-05-21 LAB — LIPID PANEL
Cholesterol: 208 mg/dL — ABNORMAL HIGH (ref 28–200)
HDL: 57 mg/dL (ref >39.00)
LDL Cholesterol: 135 mg/dL — ABNORMAL HIGH (ref 10–99)
NonHDL: 151.05
Total CHOL/HDL Ratio: 4
Triglycerides: 82 mg/dL (ref 10.0–149.0)
VLDL: 16.4 mg/dL (ref 0.0–40.0)

## 2024-05-21 LAB — COMPREHENSIVE METABOLIC PANEL WITH GFR
ALT: 23 U/L (ref 3–35)
AST: 17 U/L (ref 5–37)
Albumin: 4.5 g/dL (ref 3.5–5.2)
Alkaline Phosphatase: 100 U/L (ref 39–117)
BUN: 13 mg/dL (ref 6–23)
CO2: 27 meq/L (ref 19–32)
Calcium: 9.6 mg/dL (ref 8.4–10.5)
Chloride: 103 meq/L (ref 96–112)
Creatinine, Ser: 0.73 mg/dL (ref 0.40–1.20)
GFR: 90.49 mL/min (ref >60.00)
Glucose, Bld: 89 mg/dL (ref 70–99)
Potassium: 4.2 meq/L (ref 3.5–5.1)
Sodium: 139 meq/L (ref 135–145)
Total Bilirubin: 0.6 mg/dL (ref 0.2–1.2)
Total Protein: 7.5 g/dL (ref 6.0–8.3)

## 2024-05-21 LAB — TSH: TSH: 3.34 u[IU]/mL (ref 0.35–5.50)

## 2024-05-21 LAB — HEMOGLOBIN A1C: Hgb A1c MFr Bld: 5.8 % (ref 4.6–6.5)

## 2024-05-21 NOTE — Patient Instructions (Addendum)
 You will be due for cologuard in 12/2024  If you want to repeat this - call us  in July so I can order  If you want to do a colonoscopy instead let us  know   For bone health Try to get 1200-1500 mg of calcium per day with at least 2000 iu of vitamin D - for bone health If calcium constipates you then just get the vitamin D   Stay active  Add some strength training to your routine, this is important for bone and brain health and can reduce your risk of falls and help your body use insulin properly and regulate weight  Light weights, exercise bands , and internet videos are a good way to start  Yoga (chair or regular), machines , floor exercises or a gym with machines are also good options   If you want to do some talk therapy for stress let us  know  Your job may have counseling options as well   I put the referral in for dermatology  Please let us  know if you don't hear in 1-2 weeks to set that up (mychart message or call or letter)   Labs today   You have an order for:  [x]   3D Mammogram  []   Bone Density     Please call for appointment:   [x]   [facility]  [address] [place] [address]  [phone]  []   [facility])   [address]  [place], [place] [address]  [phone]  []   [facility]      [address] [place], [place]        [phone]         []   [facility]  [address] [place], [place]  [phone]  []  [facility]   [address]   [place], [place] [address]  [phone]  []  [facility]  [address] [place], [place] [address] [phone]    Make sure to wear two piece clothing  No lotions powders or deodorants the day of the appointment Make sure to bring picture ID and insurance card.   Bring list of medications you are currently taking including any supplements.   Schedule your screening mammogram through MyChart!   Select [facility] imaging sites can now be scheduled through MyChart.  Log into your MyChart account.  Go to Visit (or Appointments if  on mobile App) --> Schedule an  Appointment  Under Select a Reason for Visit choose the Mammogram  Screening option.  Complete the pre-visit questions  and select the time and place that  best fits your schedule

## 2024-05-21 NOTE — Assessment & Plan Note (Signed)
Disc goals for lipids and reasons to control them Rev last labs with pt Rev low sat fat diet in detail  Lab today

## 2024-05-21 NOTE — Assessment & Plan Note (Signed)
 Reviewed health habits including diet and exercise and skin cancer prevention Reviewed appropriate screening tests for age  Also reviewed health mt list, fam hx and immunization status , as well as social and family history   See HPI Labs reviewed and ordered Health Maintenance  Topic Date Due   HIV Screening  Never done   Hepatitis C Screening  Never done   Hepatitis B Vaccine (1 of 3 - 19+ 3-dose series) Never done   Pneumococcal Vaccine for age over 22 (1 of 1 - PCV) Never done   Pap with HPV screening  01/27/2022   Breast Cancer Screening  02/17/2024   Flu Shot  09/03/2024*   COVID-19 Vaccine (3 - 2025-26 season) 06/06/2025*   Zoster (Shingles) Vaccine (1 of 2) 08/19/2025*   Cologuard (Stool DNA test)  12/08/2024   DTaP/Tdap/Td vaccine (4 - Td or Tdap) 01/27/2029   HPV Vaccine  Aged Out   Meningitis B Vaccine  Aged Out  *Topic was postponed. The date shown is not the original due date.    Declines flu shot Declines shingrix vaccine  Declines gyn exam/pap  Colon cancer screening due 12/2024-pt thinks she will renew cologuard as opposed to colonoscopy (reviewed options) Discussed fall prevention, supplements and exercise for bone density  PHQ 6 due to stressors, counseling referral offered  Discussed expectations for menopause  Lab today
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
## 2024-05-21 NOTE — Assessment & Plan Note (Signed)
 A1c ordered   disc imp of low glycemic diet and wt loss to prevent DM2

## 2024-05-21 NOTE — Assessment & Plan Note (Signed)
 Several 2-3 mm pale raised/smooth areas on right side of nose   ?  Sebaceous hyperplasia vs other (want to r/o skin cancer) Refer to dermatology  Discussed sun protection

## 2024-05-21 NOTE — Assessment & Plan Note (Signed)
Hep C screen today Low risk 

## 2024-05-21 NOTE — Assessment & Plan Note (Signed)
 HIV screening today  Low risk

## 2024-05-21 NOTE — Assessment & Plan Note (Signed)
 Cologuard 12/2021   Discussed options   Thinks she will want to renew that when due in July 2026  If she changes mind and wants colonoscopy-will let us  know

## 2024-05-21 NOTE — Assessment & Plan Note (Signed)
 Mammogram order done  Pt will call to schedule  Normal exam

## 2024-05-21 NOTE — Assessment & Plan Note (Signed)
 Discussed how this problem influences overall health and the risks it imposes  Reviewed plan for weight loss with lower calorie diet (via better food choices (lower glycemic and portion control) along with exercise building up to or more than 30 minutes 5 days per week including some aerobic activity and strength training

## 2024-05-22 LAB — HIV ANTIBODY (ROUTINE TESTING W REFLEX)
HIV 1&2 Ab, 4th Generation: NONREACTIVE
HIV FINAL INTERPRETATION: NEGATIVE

## 2024-05-22 LAB — HEPATITIS C ANTIBODY: Hepatitis C Ab: NONREACTIVE

## 2024-06-14 ENCOUNTER — Ambulatory Visit
Admission: RE | Admit: 2024-06-14 | Discharge: 2024-06-14 | Disposition: A | Source: Ambulatory Visit | Attending: Family Medicine | Admitting: Family Medicine

## 2024-06-14 DIAGNOSIS — Z1231 Encounter for screening mammogram for malignant neoplasm of breast: Secondary | ICD-10-CM | POA: Insufficient documentation

## 2024-06-17 ENCOUNTER — Ambulatory Visit

## 2024-06-17 DIAGNOSIS — L820 Inflamed seborrheic keratosis: Secondary | ICD-10-CM | POA: Diagnosis not present

## 2024-06-17 DIAGNOSIS — L578 Other skin changes due to chronic exposure to nonionizing radiation: Secondary | ICD-10-CM

## 2024-06-17 DIAGNOSIS — L814 Other melanin hyperpigmentation: Secondary | ICD-10-CM

## 2024-06-17 DIAGNOSIS — W908XXA Exposure to other nonionizing radiation, initial encounter: Secondary | ICD-10-CM | POA: Diagnosis not present

## 2024-06-17 DIAGNOSIS — L821 Other seborrheic keratosis: Secondary | ICD-10-CM | POA: Diagnosis not present

## 2024-06-17 NOTE — Patient Instructions (Addendum)
 Seborrheic Keratosis  What causes seborrheic keratoses? Seborrheic keratoses are harmless, common skin growths that first appear during adult life.  As time goes by, more growths appear.  Some people may develop a large number of them.  Seborrheic keratoses appear on both covered and uncovered body parts.  They are not caused by sunlight.  The tendency to develop seborrheic keratoses can be inherited.  They vary in color from skin-colored to gray, brown, or even black.  They can be either smooth or have a rough, warty surface.   Seborrheic keratoses are superficial and look as if they were stuck on the skin.  Under the microscope this type of keratosis looks like layers upon layers of skin.  That is why at times the top layer may seem to fall off, but the rest of the growth remains and re-grows.    Treatment Seborrheic keratoses do not need to be treated, but can easily be removed in the office.  Seborrheic keratoses often cause symptoms when they rub on clothing or jewelry.  Lesions can be in the way of shaving.  If they become inflamed, they can cause itching, soreness, or burning.  Removal of a seborrheic keratosis can be accomplished by freezing, burning, or surgery. If any spot bleeds, scabs, or grows rapidly, please return to have it checked, as these can be an indication of a skin cancer.  Cryosurgery  Cryosurgery (freezing) uses liquid nitrogen to destroy certain types of skin lesions. Lowering the temperature of the lesion in a small area surrounding skin destroys the lesion. Immediately following cryosurgery, you will notice redness and swelling of the treatment area. Blistering or weeping may occur, lasting approximately one week which will then be followed by crusting. Most areas will heal completely in 10 to 14 days.  Wash the treated areas daily. Allow soap and water to run over the areas, but do not scrub. Should a scab or crust form, allow it to fall off on its own. Do not remove or  pick at it. Application of an ointment  and a bandage may make you feel more comfortable, but it is not necessary. Some people develop an allergy to Neosporin, so we recommend that Vaseline or  Aquaphor be used.  The cryotherapy site will be more sensitive than your surrounding skin. Keep it covered, and remember to apply sunscreen every day to all your sun exposed skin. A scar may remain which is lighter or pinker than your normal skin. Your body will continue to improve your scar for up to one year; however a light-colored scar may remain.  Infection following cryotherapy is rare. However if you are worried about the appearance of the treated area, contact your doctor. We have a physician on call at all times. If you have any concerns about the site, please call our clinic at [phone]   Due to recent changes in healthcare laws, you may see results of your pathology and/or laboratory studies on MyChart before the doctors have had a chance to review them. We understand that in some cases there may be results that are confusing or concerning to you. Please understand that not all results are received at the same time and often the doctors may need to interpret multiple results in order to provide you with the best plan of care or course of treatment. Therefore, we ask that you please give us  2 business days to thoroughly review all your results before contacting the office for clarification. Should we see a critical  lab result, you will be contacted sooner.   If You Need Anything After Your Visit  If you have any questions or concerns for your doctor, please call our main line at [phone] and press option 4 to reach your doctor's medical assistant. If no one answers, please leave a voicemail as directed and we will return your call as soon as possible. Messages left after 4 pm will be answered the following business day.   You may also send us  a message via MyChart. We typically respond to  MyChart messages within 1-2 business days.  For prescription refills, please ask your pharmacy to contact our office. Our fax number is [phone].  If you have an urgent issue when the clinic is closed that cannot wait until the next business day, you can page your doctor at the number below.    Please note that while we do our best to be available for urgent issues outside of office hours, we are not available 24/7.   If you have an urgent issue and are unable to reach us , you may choose to seek medical care at your doctor's office, retail clinic, urgent care center, or emergency room.  If you have a medical emergency, please immediately call 911 or go to the emergency department.  Pager Numbers  - Dr. Hester: [phone]  - Dr. Jackquline: [phone]  - Dr. Claudene: [phone]   - Dr. Raymund: [phone]  In the event of inclement weather, please call our main line at [phone] for an update on the status of any delays or closures.  Dermatology Medication Tips: Please keep the boxes that topical medications come in in order to help keep track of the instructions about where and how to use these. Pharmacies typically print the medication instructions only on the boxes and not directly on the medication tubes.   If your medication is too expensive, please contact our office at [phone] option 4 or send us  a message through MyChart.   We are unable to tell what your co-pay for medications will be in advance as this is different depending on your insurance coverage. However, we may be able to find a substitute medication at lower cost or fill out paperwork to get insurance to cover a needed medication.   If a prior authorization is required to get your medication covered by your insurance company, please allow us  1-2 business days to complete this process.  Drug prices often vary depending on where the prescription is filled and some pharmacies may offer cheaper  prices.  The website www.goodrx.com contains coupons for medications through different pharmacies. The prices here do not account for what the cost may be with help from insurance (it may be cheaper with your insurance), but the website can give you the price if you did not use any insurance.  - You can print the associated coupon and take it with your prescription to the pharmacy.  - You may also stop by our office during regular business hours and pick up a GoodRx coupon card.  - If you need your prescription sent electronically to a different pharmacy, notify our office through [facility] or by phone at [phone] option 4.     Si Usted Necesita Algo Despus de Su Visita  Tambin puede enviarnos un mensaje a travs de Clinical Cytogeneticist. Por lo general respondemos a los mensajes de MyChart en el transcurso de 1 a 2 das hbiles.  Para renovar recetas, por favor pida a su farmacia que se  ponga en contacto con nuestra oficina. Randi lakes de fax es [place] [phone].  Si tiene un asunto urgente cuando la clnica est cerrada y que no puede esperar hasta el siguiente da hbil, puede llamar/localizar a su doctor(a) al nmero que aparece a continuacin.   Por favor, tenga en cuenta que aunque hacemos todo lo posible para estar disponibles para asuntos urgentes fuera del horario de [place], no estamos disponibles las 24 horas del da, los 7 [address]  [address] de la [place].   Si tiene un problema urgente y no puede comunicarse con nosotros, puede optar por buscar atencin mdica  en el consultorio de su doctor(a), en una clnica privada, en un centro de atencin urgente o en una sala de emergencias.  Si tiene engineer, drilling, por favor llame inmediatamente al 911 o vaya a la sala de emergencias.  Nmeros de bper  - Dr. Hester: [phone]  - Dra. Jackquline: [phone]  - Dr. Claudene: [phone]  - Dra. Kitts: [phone]  En caso de inclemencias del [place], por favor llame a nuestra  lnea principal al [phone] para una actualizacin sobre el estado de cualquier retraso o cierre.  Consejos para la medicacin en dermatologa: Por favor, guarde las cajas en las que vienen los medicamentos de uso tpico para ayudarle a seguir las instrucciones sobre dnde y cmo usarlos. Las farmacias generalmente imprimen las instrucciones del medicamento slo en las cajas y no directamente en los tubos del [place].   Si su medicamento es muy caro, por favor, pngase en contacto con landry rieger llamando al [phone] y presione la opcin 4 o envenos un mensaje a travs de Clinical Cytogeneticist.   No podemos decirle cul ser su copago por los medicamentos por adelantado ya que esto es diferente dependiendo de la cobertura de su seguro. Sin embargo, es posible que podamos encontrar un medicamento sustituto a audiological scientist un formulario para que el seguro cubra el medicamento que se considera necesario.   Si se requiere una autorizacin previa para que su compaa de seguros cubra su medicamento, por favor permtanos de 1 a 2 das hbiles para completar este proceso.  Los precios de los medicamentos varan con frecuencia dependiendo del environmental consultant de dnde se surte la receta y alguna farmacias pueden ofrecer precios ms baratos.  El sitio web www.goodrx.com tiene cupones para medicamentos de health and safety inspector. Los precios aqu no tienen en cuenta lo que podra costar con la ayuda del seguro (puede ser ms barato con su seguro), pero el sitio web puede darle el precio si no utiliz tourist information centre manager.  - Puede imprimir el cupn correspondiente y llevarlo con su receta a la farmacia.  - Tambin puede pasar por nuestra oficina durante el horario de atencin regular y education officer, museum una tarjeta de cupones de GoodRx.  - Si necesita que su receta se enve electrnicamente a una farmacia diferente, informe a nuestra oficina a travs de MyChart de [place] o por telfono llamando al [phone] y presione la  opcin 4.

## 2024-06-17 NOTE — Progress Notes (Signed)
" °  °  Subjective   Miranda Griffith is a 59 y.o. female who presents for the following: Lesion(s) of concern . Patient is new patient  Today patient reports: Patient states she has a LOC on nose. Sometimes itchy, only applies makeup and moisturizer. Has been present for several months.  Review of Systems:    No other skin or systemic complaints except as noted in HPI or Assessment and Plan.  The following portions of the chart were reviewed this encounter and updated as appropriate: medications, allergies, medical history  Relevant Medical History:  n/a   Objective  (SKPE) Well appearing patient in no apparent distress; mood and affect are within normal limits. Examination was performed of the: Focused Exam of: face   Examination notable for: Lentigo/lentigines: Scattered pigmented macules that are tan to brown in color and are somewhat non-uniform in shape and concentrated in the sun-exposed areas, Seborrheic Keratosis(es): Stuck-on appearing keratotic papule(s) on the trunk, some  irritated with redness, crusting, edema, and/or partial avulsion, Actinic Damage/Elastosis: chronic sun damage: dyspigmentation, telangiectasia, and wrinkling  Examination limited by: Undergarments, Shoes or socks , and Clothing   nose x1 Stuck-on, waxy, tan-brown papules and plaques -- Discussed benign etiology and prognosis.     Assessment & Plan  (SKAP)    BENIGN SKIN FINDINGS  - Lentigines  - Seborrheic keratoses - Reassurance provided regarding the benign appearance of lesions noted on exam today; no treatment is indicated in the absence of symptoms/changes. - Reinforced importance of photoprotective strategies including liberal and frequent sunscreen use of a broad-spectrum SPF 30 or greater, use of protective clothing, and sun avoidance for prevention of cutaneous malignancy and photoaging.  Counseled patient on the importance of regular self-skin monitoring as well as routine clinical skin examinations  as scheduled.   ACTINIC DAMAGE - Chronic condition, secondary to cumulative UV/sun exposure - Recommend daily broad spectrum sunscreen SPF 30+ to sun-exposed areas, reapply every 2 hours as needed.  - Staying in the shade or wearing long sleeves, sun glasses (UVA+UVB protection) and wide brim hats (4-inch brim around the entire circumference of the hat) are also recommended for sun protection.  - Call for new or changing lesions.  Was sun protection counseling provided?: Yes   Level of service outlined above   Patient instructions (SKPI)   Procedures, orders, diagnosis for this visit:  INFLAMED SEBORRHEIC KERATOSIS nose x1 - Destruction of lesion - nose x1 Complexity: simple   Destruction method: cryotherapy   Informed consent: discussed and consent obtained   Timeout:  patient name, date of birth, surgical site, and procedure verified Lesion destroyed using liquid nitrogen: Yes   Region frozen until ice ball extended beyond lesion: Yes   Cryo cycles: 1 or 2. Outcome: patient tolerated procedure well with no complications   Post-procedure details: wound care instructions given   Additional details:  Prior to procedure, discussed risks of blister formation, small wound, skin dyspigmentation, or rare scar following cryotherapy. Recommend Vaseline ointment to treated areas while healing.    Inflamed seborrheic keratosis -     Destruction of lesion    Return to clinic: Return if symptoms worsen or fail to improve, for w/ Dr. Raymund.  I, Almetta Nora, RMA, am acting as scribe for Lauraine JAYSON Raymund, MD .  Documentation: I have reviewed the above documentation for accuracy and completeness, and I agree with the above.  Lauraine JAYSON Raymund, MD  "
